# Patient Record
Sex: Female | Born: 1985 | Race: Black or African American | Hispanic: No | Marital: Single | Smoking: Current some day smoker
Health system: Southern US, Community
[De-identification: ages and names within clinical notes are randomized; demographics above are authoritative.]

## PROBLEM LIST (undated history)

## (undated) ENCOUNTER — Inpatient Hospital Stay (HOSPITAL_COMMUNITY): Payer: Self-pay

## (undated) DIAGNOSIS — Z789 Other specified health status: Secondary | ICD-10-CM

## (undated) DIAGNOSIS — Z3490 Encounter for supervision of normal pregnancy, unspecified, unspecified trimester: Secondary | ICD-10-CM

## (undated) DIAGNOSIS — A599 Trichomoniasis, unspecified: Secondary | ICD-10-CM

## (undated) DIAGNOSIS — J40 Bronchitis, not specified as acute or chronic: Secondary | ICD-10-CM

## (undated) DIAGNOSIS — F4310 Post-traumatic stress disorder, unspecified: Secondary | ICD-10-CM

## (undated) DIAGNOSIS — Z87440 Personal history of urinary (tract) infections: Secondary | ICD-10-CM

## (undated) HISTORY — DX: Post-traumatic stress disorder, unspecified: F43.10

## (undated) HISTORY — PX: NO PAST SURGERIES: SHX2092

---

## 2010-04-11 ENCOUNTER — Emergency Department (HOSPITAL_BASED_OUTPATIENT_CLINIC_OR_DEPARTMENT_OTHER)
Admission: EM | Admit: 2010-04-11 | Discharge: 2010-04-11 | Disposition: A | Discharge status: Home / Self Care | Payer: Self-pay | Attending: Emergency Medicine | Admitting: Emergency Medicine

## 2010-04-11 DIAGNOSIS — K089 Disorder of teeth and supporting structures, unspecified: Secondary | ICD-10-CM | POA: Insufficient documentation

## 2010-04-11 DIAGNOSIS — F17200 Nicotine dependence, unspecified, uncomplicated: Secondary | ICD-10-CM | POA: Insufficient documentation

## 2011-01-16 ENCOUNTER — Emergency Department (HOSPITAL_BASED_OUTPATIENT_CLINIC_OR_DEPARTMENT_OTHER)
Admission: EM | Admit: 2011-01-16 | Discharge: 2011-01-16 | Disposition: A | Discharge status: Home / Self Care | Payer: Self-pay | Attending: Emergency Medicine | Admitting: Emergency Medicine

## 2011-01-16 ENCOUNTER — Encounter: Payer: Self-pay | Admitting: *Deleted

## 2011-01-16 DIAGNOSIS — F17200 Nicotine dependence, unspecified, uncomplicated: Secondary | ICD-10-CM | POA: Insufficient documentation

## 2011-01-16 DIAGNOSIS — Z2089 Contact with and (suspected) exposure to other communicable diseases: Secondary | ICD-10-CM | POA: Insufficient documentation

## 2011-01-16 DIAGNOSIS — R21 Rash and other nonspecific skin eruption: Secondary | ICD-10-CM | POA: Insufficient documentation

## 2011-01-16 NOTE — ED Notes (Signed)
Rash on her legs and abdomen for about a month. Itching. All the family members have the same.

## 2011-01-16 NOTE — ED Provider Notes (Signed)
Medical screening examination/treatment/procedure(s) were performed by non-physician practitioner and as supervising physician I was immediately available for consultation/collaboration.   Dayton Bailiff, MD 01/16/11 [phone]

## 2011-01-16 NOTE — ED Provider Notes (Signed)
History     CSN: [id number] Arrival date & time: 01/16/2011  6:21 PM   First MD Initiated Contact with Patient 01/16/11 1828      Chief Complaint  Patient presents with  . Rash    (Consider location/radiation/quality/duration/timing/severity/associated sxs/prior treatment) Patient is a 25 y.o. female presenting with rash. The history is provided by the patient. No language interpreter was used.  Rash  This is a new problem. The current episode started more than 2 days ago. The problem has not changed since onset.The problem is associated with an unknown factor. The rash is present on the abdomen. Associated symptoms include itching.    History reviewed. No pertinent past medical history.  History reviewed. No pertinent past surgical history.  No family history on file.  History  Substance Use Topics  . Smoking status: Current Everyday Smoker -- 1.0 packs/day  . Smokeless tobacco: Not on file  . Alcohol Use: No    OB History    Grav Para Term Preterm Abortions TAB SAB Ect Mult Living                  Review of Systems  Constitutional: Negative.   Respiratory: Negative.   Cardiovascular: Negative.   Skin: Positive for itching and rash.    Allergies  Review of patient's allergies indicates no known allergies.  Home Medications  No current outpatient prescriptions on file.  BP 109/81  Pulse 76  Temp(Src) 98.3 F (36.8 C) (Oral)  Resp 20  SpO2 98%  Physical Exam  Nursing note and vitals reviewed. Constitutional: She appears well-nourished.  Cardiovascular: Normal rate and regular rhythm.   Pulmonary/Chest: Effort normal and breath sounds normal.  Skin:       Pt has a patch of dry scaly skin to abdomen    ED Course  Procedures (including critical care time)  Labs Reviewed - No data to display No results found.   1. Scabies exposure       MDM  Pt son has scabies:rash not consistent with scabies        Teressa Lower, NP 01/16/11  1851

## 2011-11-07 ENCOUNTER — Encounter (HOSPITAL_COMMUNITY): Payer: Self-pay | Admitting: *Deleted

## 2011-11-07 ENCOUNTER — Emergency Department (INDEPENDENT_AMBULATORY_CARE_PROVIDER_SITE_OTHER)
Admission: EM | Admit: 2011-11-07 | Discharge: 2011-11-07 | Disposition: A | Discharge status: Home / Self Care | Payer: Self-pay | Source: Home / Self Care | Attending: Emergency Medicine | Admitting: Emergency Medicine

## 2011-11-07 DIAGNOSIS — H6590 Unspecified nonsuppurative otitis media, unspecified ear: Secondary | ICD-10-CM

## 2011-11-07 DIAGNOSIS — N760 Acute vaginitis: Secondary | ICD-10-CM

## 2011-11-07 DIAGNOSIS — J019 Acute sinusitis, unspecified: Secondary | ICD-10-CM

## 2011-11-07 DIAGNOSIS — A499 Bacterial infection, unspecified: Secondary | ICD-10-CM

## 2011-11-07 DIAGNOSIS — R300 Dysuria: Secondary | ICD-10-CM

## 2011-11-07 DIAGNOSIS — H109 Unspecified conjunctivitis: Secondary | ICD-10-CM

## 2011-11-07 DIAGNOSIS — R109 Unspecified abdominal pain: Secondary | ICD-10-CM

## 2011-11-07 DIAGNOSIS — B9689 Other specified bacterial agents as the cause of diseases classified elsewhere: Secondary | ICD-10-CM

## 2011-11-07 LAB — POCT URINALYSIS DIP (DEVICE)
Glucose, UA: NEGATIVE mg/dL
Hgb urine dipstick: NEGATIVE
Nitrite: NEGATIVE
Urobilinogen, UA: 2 mg/dL — ABNORMAL HIGH (ref 0.0–1.0)
pH: 7 (ref 5.0–8.0)

## 2011-11-07 LAB — WET PREP, GENITAL: Yeast Wet Prep HPF POC: NONE SEEN

## 2011-11-07 MED ORDER — BENZONATATE 200 MG PO CAPS: 200.0000 mg | ORAL_CAPSULE | Freq: Three times a day (TID) | ORAL | Status: AC | PRN

## 2011-11-07 MED ORDER — METRONIDAZOLE 500 MG PO TABS: 500.0000 mg | ORAL_TABLET | Freq: Two times a day (BID) | ORAL | Status: AC

## 2011-11-07 MED ORDER — AMOXICILLIN 500 MG PO CAPS: 1000.0000 mg | ORAL_CAPSULE | Freq: Three times a day (TID) | ORAL | Status: AC

## 2011-11-07 MED ORDER — POLYMYXIN B-TRIMETHOPRIM 10000-0.1 UNIT/ML-% OP SOLN: 1.0000 [drp] | OPHTHALMIC | Status: AC

## 2011-11-07 NOTE — ED Notes (Signed)
Pt  Reports  Symptoms  Of  Irritated  Crusty  Drainage   From  Both  Eyes    Symptoms  Began  About  4  Days  Ago   -  She        denys  Any  Known  Foreign  Body

## 2011-11-07 NOTE — ED Provider Notes (Addendum)
Chief Complaint  Patient presents with  . Eye Drainage    History of Present Illness:   The patient is a 26 year old female who comes in today with a number of different complaints: URI symptoms, drainage from her eyes, abdominal pain, urinary frequency, and vaginal discharge.  For the past 5 days she has had redness, itching, and pain in both of her eyes along with some purulent drainage. She denies any visual changes. She's felt chilled, had sore throat, nasal congestion with clear rhinorrhea, congestion in both of her ears, and a dry cough. She denies any fever.  She also has had a one-week history of a recurring pain in her entire right hemiabdomen ranging from the right lower quadrant to the right upper quadrant and the right flank. This comes and goes. She's not had any associated nausea or vomiting. Her appetite has been good and she has not had any weight loss. The pain is not any worse with eating or with intercourse. She also has had some urinary frequency but denies urgency, dysuria, or hematuria. She's had some slight vaginal discharge and itching. Her menses have been regular.  Review of Systems:  Other than noted above, the patient denies any of the following symptoms. Systemic:  No fever, chills, sweats, fatigue, myalgias, headache, or anorexia. Eye:  No redness, pain or drainage. ENT:  No earache, nasal congestion, rhinorrhea, sinus pressure, or sore throat. Lungs:  No cough, sputum production, wheezing, shortness of breath.  Cardiovascular:  No chest pain, palpitations, or syncope. GI:  No nausea, vomiting, abdominal pain or diarrhea. GU:  No dysuria, frequency, or hematuria. Skin:  No rash or pruritis.  PMFSH:  Past medical history, family history, social history, meds, and allergies were reviewed.   Physical Exam:   Vital signs:  BP 105/68  Pulse 58  Temp 98.5 F (36.9 C)  Resp 16  SpO2 98%  LMP 10/24/2011 General:  Alert, in no distress. Eye:  PERRL, full EOMs.   Lids and periorbital tissues are normal, conjunctiva is were slightly injected, there was no discharge, but there was some crusting on the eyelids. ENT:  There was fluid behind the left TM but no erythema, the right TM and canal are normal.  Nasal mucosa was congested with some yellowish drainage on the left.  Mucous membranes were moist.  Pharynx was clear, without exudate or drainage.  There were no oral ulcerations or lesions. Neck:  Supple, no adenopathy, tenderness or mass. Thyroid was normal. Lungs:  No respiratory distress.  Lungs were clear to auscultation, without wheezes, rales or rhonchi.  Breath sounds were clear and equal bilaterally. Heart:  Regular rhythm, without gallops, murmers or rubs. Abdomen:  Soft, flat, with mild tenderness to palpation in the right lower quadrant and right flank without guarding or rebound.  No hepatosplenomagaly or mass. Bowel sounds are normally active. Pelvic: Normal external genitalia. Vaginal and cervical mucosa were normal. There was a moderate amount of homogeneous, white, non-malodorous discharge. There was no cervical motion pain. Uterus was midposition and normal in size and shape without any tenderness. She has mild left adnexal tenderness without a mass and no right adnexal tenderness or mass. Skin:  Clear, warm, and dry, without rash or lesions.    Labs:   Results for orders placed during the hospital encounter of 11/07/11  POCT URINALYSIS DIP (DEVICE)      Component Value Range   Glucose, UA NEGATIVE  NEGATIVE mg/dL   Bilirubin Urine SMALL (*) NEGATIVE  Ketones, ur NEGATIVE  NEGATIVE mg/dL   Specific Gravity, Urine 1.020  1.005 - 1.030   Hgb urine dipstick NEGATIVE  NEGATIVE   pH 7.0  5.0 - 8.0   Protein, ur 30 (*) NEGATIVE mg/dL   Urobilinogen, UA 2.0 (*) 0.0 - 1.0 mg/dL   Nitrite NEGATIVE  NEGATIVE   Leukocytes, UA TRACE (*) NEGATIVE  WET PREP, GENITAL      Component Value Range   Yeast Wet Prep HPF POC NONE SEEN  NONE SEEN    Trich, Wet Prep FEW (*) NONE SEEN   Clue Cells Wet Prep HPF POC MANY (*) NONE SEEN   WBC, Wet Prep HPF POC MANY (*) NONE SEEN    Other Labs Obtained at Urgent Care Center:  A urine culture was obtained as well as cervical smear for GC and Chlamydia DNA probe.  Results are pending at this time and we will call about any positive results.  Assessment:  The primary encounter diagnosis was Conjunctivitis. Diagnoses of Acute sinusitis, Serous otitis media, Bacterial vaginosis, Abdominal  pain, other specified site, and Dysuria were also pertinent to this visit.  She appears to have a several problems. The first is an upper respiratory infection with conjunctivitis, sinusitis, and serous otitis media. The second problem is that of vaginal discharge and she has evidence of both bacterial vaginosis and Trichomonas vaginitis as well. This will be treated with metronidazole. I'm not sure is the cause of her abdominal pain, but is suggested she see her gynecologist for followup on this, if it persists she will probably need a pelvic ultrasound.  Plan:   1.  The following meds were prescribed:   New Prescriptions   AMOXICILLIN (AMOXIL) 500 MG CAPSULE    Take 2 capsules (1,000 mg total) by mouth 3 (three) times daily.   BENZONATATE (TESSALON) 200 MG CAPSULE    Take 1 capsule (200 mg total) by mouth 3 (three) times daily as needed for cough.   METRONIDAZOLE (FLAGYL) 500 MG TABLET    Take 1 tablet (500 mg total) by mouth 2 (two) times daily.   TRIMETHOPRIM-POLYMYXIN B (POLYTRIM) OPHTHALMIC SOLUTION    Place 1 drop into both eyes every 4 (four) hours.   2.  The patient was instructed in symptomatic care and handouts were given. 3.  The patient was told to return if becoming worse in any way, if no better in 3 or 4 days, and given some red flag symptoms that would indicate earlier return.    Reuben Likes, MD 11/07/11 1610  Reuben Likes, MD 11/07/11 [phone]

## 2011-11-08 LAB — GC/CHLAMYDIA PROBE AMP, GENITAL: GC Probe Amp, Genital: NEGATIVE

## 2011-11-09 LAB — URINE CULTURE

## 2011-11-10 ENCOUNTER — Telehealth (HOSPITAL_COMMUNITY): Payer: Self-pay | Admitting: *Deleted

## 2011-11-10 NOTE — ED Notes (Signed)
Gc/Chlamydia neg., Wet prep: Few trich, many clue cells, many WBC's, Urine culture: 60,000 colonies mult. bacterial types none predominant.  Pt. adequately treated with Flagyl.   I called and left a message for pt. To call back Mon after 1130.Vassie Moselle 11/10/2011

## 2011-11-13 ENCOUNTER — Telehealth (HOSPITAL_COMMUNITY): Payer: Self-pay | Admitting: *Deleted

## 2011-11-13 NOTE — ED Notes (Signed)
Pt. called back on VM on 9/13.  Pt. called back again now.  Pt. verified x 2 and given results.  Pt. told she was adequately treated for trich and bacterial vaginosis with Flagyl. You need to notify your partner to be treated with Flagyl, no sex until you have finished your medication and your partner has been treated and to practice safe sex. You can get HIV testing at the [facility] STD clinic.  Pt. asked if I can call in a Rx. For her partner. I told her he would have to see the doctor. Vassie Moselle 11/13/2011

## 2011-11-23 ENCOUNTER — Emergency Department (HOSPITAL_COMMUNITY)
Admission: EM | Admit: 2011-11-23 | Discharge: 2011-11-23 | Disposition: A | Discharge status: Home / Self Care | Payer: No Typology Code available for payment source | Attending: Emergency Medicine | Admitting: Emergency Medicine

## 2011-11-23 ENCOUNTER — Emergency Department (HOSPITAL_COMMUNITY): Payer: No Typology Code available for payment source

## 2011-11-23 ENCOUNTER — Encounter (HOSPITAL_COMMUNITY): Payer: Self-pay | Admitting: Emergency Medicine

## 2011-11-23 DIAGNOSIS — F17200 Nicotine dependence, unspecified, uncomplicated: Secondary | ICD-10-CM | POA: Insufficient documentation

## 2011-11-23 DIAGNOSIS — S60219A Contusion of unspecified wrist, initial encounter: Secondary | ICD-10-CM | POA: Insufficient documentation

## 2011-11-23 MED ORDER — IBUPROFEN 800 MG PO TABS: 800.0000 mg | ORAL_TABLET | Freq: Three times a day (TID) | ORAL | Status: DC

## 2011-11-23 MED ORDER — IBUPROFEN 400 MG PO TABS
800.0000 mg | ORAL_TABLET | Freq: Once | ORAL | Status: AC
  Administered 2011-11-23: 800 mg via ORAL
  Filled 2011-11-23: qty 2

## 2011-11-23 NOTE — Discharge Instructions (Signed)
Contusion There is no evidence of broken bone. Follow up with the orthopedic doctor as needed. Return to the ED if you develop new or worsening symptoms. A contusion is a deep bruise. Contusions are the result of an injury that caused bleeding under the skin. The contusion may turn blue, purple, or yellow. Minor injuries will give you a painless contusion, but more severe contusions may stay painful and swollen for a few weeks.  CAUSES  A contusion is usually caused by a blow, trauma, or direct force to an area of the body. SYMPTOMS   Swelling and redness of the injured area.   Bruising of the injured area.   Tenderness and soreness of the injured area.   Pain.  DIAGNOSIS  The diagnosis can be made by taking a history and physical exam. An X-ray, CT scan, or MRI may be needed to determine if there were any associated injuries, such as fractures. TREATMENT  Specific treatment will depend on what area of the body was injured. In general, the best treatment for a contusion is resting, icing, elevating, and applying cold compresses to the injured area. Over-the-counter medicines may also be recommended for pain control. Ask your caregiver what the best treatment is for your contusion. HOME CARE INSTRUCTIONS   Put ice on the injured area.   Put ice in a plastic bag.   Place a towel between your skin and the bag.   Leave the ice on for 15 to 20 minutes, 3 to 4 times a day.   Only take over-the-counter or prescription medicines for pain, discomfort, or fever as directed by your caregiver. Your caregiver may recommend avoiding anti-inflammatory medicines (aspirin, ibuprofen, and naproxen) for 48 hours because these medicines may increase bruising.   Rest the injured area.   If possible, elevate the injured area to reduce swelling.  SEEK IMMEDIATE MEDICAL CARE IF:   You have increased bruising or swelling.   You have pain that is getting worse.   Your swelling or pain is not relieved  with medicines.  MAKE SURE YOU:   Understand these instructions.   Will watch your condition.   Will get help right away if you are not doing well or get worse.  Document Released: 11/23/2004 Document Revised: 02/02/2011 Document Reviewed: 12/19/2010 [facility] Patient Information 2012 [place], [place].

## 2011-11-23 NOTE — Progress Notes (Signed)
Orthopedic Tech Progress Note Patient Details:  Valerie Reyes [birth year] [id number]  Ortho Devices Type of Ortho Device: Velcro wrist splint Ortho Device/Splint Location: left wrist splint Ortho Device/Splint Interventions: Application   Cammer, Mickie Bail 11/23/2011, 2:01 PM
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
## 2012-01-19 ENCOUNTER — Emergency Department (HOSPITAL_COMMUNITY): Payer: Self-pay

## 2012-01-19 ENCOUNTER — Emergency Department (HOSPITAL_COMMUNITY)
Admission: EM | Admit: 2012-01-19 | Discharge: 2012-01-19 | Disposition: A | Discharge status: Home / Self Care | Payer: Self-pay | Attending: Emergency Medicine | Admitting: Emergency Medicine

## 2012-01-19 ENCOUNTER — Encounter (HOSPITAL_COMMUNITY): Payer: Self-pay | Admitting: Emergency Medicine

## 2012-01-19 DIAGNOSIS — O99330 Smoking (tobacco) complicating pregnancy, unspecified trimester: Secondary | ICD-10-CM | POA: Insufficient documentation

## 2012-01-19 DIAGNOSIS — R300 Dysuria: Secondary | ICD-10-CM | POA: Insufficient documentation

## 2012-01-19 DIAGNOSIS — N949 Unspecified condition associated with female genital organs and menstrual cycle: Secondary | ICD-10-CM | POA: Insufficient documentation

## 2012-01-19 DIAGNOSIS — Z3490 Encounter for supervision of normal pregnancy, unspecified, unspecified trimester: Secondary | ICD-10-CM

## 2012-01-19 DIAGNOSIS — R350 Frequency of micturition: Secondary | ICD-10-CM | POA: Insufficient documentation

## 2012-01-19 DIAGNOSIS — R109 Unspecified abdominal pain: Secondary | ICD-10-CM | POA: Insufficient documentation

## 2012-01-19 DIAGNOSIS — Z8709 Personal history of other diseases of the respiratory system: Secondary | ICD-10-CM | POA: Insufficient documentation

## 2012-01-19 DIAGNOSIS — O9989 Other specified diseases and conditions complicating pregnancy, childbirth and the puerperium: Secondary | ICD-10-CM | POA: Insufficient documentation

## 2012-01-19 HISTORY — DX: Encounter for supervision of normal pregnancy, unspecified, unspecified trimester: Z34.90

## 2012-01-19 HISTORY — DX: Bronchitis, not specified as acute or chronic: J40

## 2012-01-19 LAB — CBC WITH DIFFERENTIAL/PLATELET
Basophils Absolute: 0 K/uL (ref 0.0–0.1)
Basophils Relative: 0 % (ref 0–1)
Eosinophils Absolute: 0.1 K/uL (ref 0.0–0.7)
Eosinophils Relative: 1 % (ref 0–5)
HCT: 38.2 % (ref 36.0–46.0)
Hemoglobin: 13.4 g/dL (ref 12.0–15.0)
Lymphocytes Relative: 30 % (ref 12–46)
Lymphs Abs: 2.8 K/uL (ref 0.7–4.0)
MCH: 29.8 pg (ref 26.0–34.0)
MCHC: 35.1 g/dL (ref 30.0–36.0)
MCV: 84.9 fL (ref 78.0–100.0)
Monocytes Absolute: 0.5 K/uL (ref 0.1–1.0)
Monocytes Relative: 6 % (ref 3–12)
Neutro Abs: 5.9 K/uL (ref 1.7–7.7)
Neutrophils Relative %: 63 % (ref 43–77)
Platelets: 219 K/uL (ref 150–400)
RBC: 4.5 MIL/uL (ref 3.87–5.11)
RDW: 12.5 % (ref 11.5–15.5)
WBC: 9.4 K/uL (ref 4.0–10.5)

## 2012-01-19 LAB — BASIC METABOLIC PANEL WITH GFR
BUN: 6 mg/dL (ref 6–23)
CO2: 22 meq/L (ref 19–32)
Calcium: 9.3 mg/dL (ref 8.4–10.5)
Chloride: 98 meq/L (ref 96–112)
Creatinine, Ser: 0.5 mg/dL (ref 0.50–1.10)
GFR calc Af Amer: >90 mL/min
GFR calc non Af Amer: >90 mL/min
Glucose, Bld: 89 mg/dL (ref 70–99)
Potassium: 3.8 meq/L (ref 3.5–5.1)
Sodium: 131 meq/L — ABNORMAL LOW (ref 135–145)

## 2012-01-19 LAB — URINALYSIS, ROUTINE W REFLEX MICROSCOPIC
Glucose, UA: NEGATIVE mg/dL
Hgb urine dipstick: NEGATIVE
Ketones, ur: NEGATIVE mg/dL
Protein, ur: NEGATIVE mg/dL

## 2012-01-19 LAB — WET PREP, GENITAL
Clue Cells Wet Prep HPF POC: NONE SEEN
Trich, Wet Prep: NONE SEEN
Yeast Wet Prep HPF POC: NONE SEEN

## 2012-01-19 LAB — HCG, QUANTITATIVE, PREGNANCY: hCG, Beta Chain, Quant, S: 114067 m[IU]/mL — ABNORMAL HIGH

## 2012-01-19 MED ORDER — CEFTRIAXONE SODIUM 250 MG IJ SOLR
250.0000 mg | Freq: Once | INTRAMUSCULAR | Status: AC
  Administered 2012-01-19: 250 mg via INTRAMUSCULAR
  Filled 2012-01-19: qty 250

## 2012-01-19 MED ORDER — PRENATAL COMPLETE 14-0.4 MG PO TABS: 1.0000 | ORAL_TABLET | Freq: Every day | ORAL | Status: DC

## 2012-01-19 MED ORDER — AZITHROMYCIN 250 MG PO TABS
1000.0000 mg | ORAL_TABLET | Freq: Once | ORAL | Status: AC
  Administered 2012-01-19: 1000 mg via ORAL
  Filled 2012-01-19: qty 4

## 2012-01-19 NOTE — ED Notes (Signed)
Patient is 8.[redacted] weeks pregnant.  Patient c/o bilateral lower abdominal and right flank pain.  Patient complains that pain worsens with urination.  Patient denies fevers.

## 2012-01-19 NOTE — ED Notes (Signed)
Lab at bedside

## 2012-01-19 NOTE — ED Provider Notes (Signed)
History     CSN: [id number]  Arrival date & time 01/19/12  1544   First MD Initiated Contact with Patient 01/19/12 1600      Chief Complaint  Patient presents with  . Dysuria  . Abdominal Pain    (Consider location/radiation/quality/duration/timing/severity/associated sxs/prior treatment) HPI Comments: Patient G1P0A0 presents to ED with complaint of lower abdominal pain. Associated symptoms include suprapubic urgency and frequency. Patient LMP was 11/22/11. She states that she recently went to the pregnancy clinic who told her that her fetus is viable. Currently taking prenatal vitamins with plans to establish appropriate prenatal care. Denies fever or chills. Denies vomiting or diarrhea. Denies vaginal bleeding but reports concerning discharge. Sexually active without barrier method.  The history is provided by the patient. No language interpreter was used.    Past Medical History  Diagnosis Date  . Pregnancy   . Bronchitis     History reviewed. No pertinent past surgical history.  History reviewed. No pertinent family history.  History  Substance Use Topics  . Smoking status: Current Every Day Smoker -- 1.0 packs/day    Types: Cigarettes  . Smokeless tobacco: Not on file  . Alcohol Use: No    OB History    Grav Para Term Preterm Abortions TAB SAB Ect Mult Living                  Review of Systems  Constitutional: Negative for fever and chills.  Gastrointestinal: Positive for nausea and abdominal pain. Negative for vomiting and diarrhea.  Genitourinary: Positive for menstrual problem and pelvic pain.    Allergies  Review of patient's allergies indicates no known allergies.  Home Medications   Current Outpatient Rx  Name  Route  Sig  Dispense  Refill  . IBUPROFEN 800 MG PO TABS   Oral   Take 800 mg by mouth 3 (three) times daily. Pain           BP 102/68  Pulse 71  Temp 99.5 F (37.5 C) (Oral)  Resp 20  Ht 5\' 2"  (1.575 m)  Wt 122 lb (55.339 kg)   BMI 22.31 kg/m2  SpO2 100%  LMP 11/22/2011  Physical Exam  Nursing note and vitals reviewed. Constitutional: She appears well-developed and well-nourished.  HENT:  Head: Normocephalic and atraumatic.  Mouth/Throat: Oropharynx is clear and moist.  Eyes: Pupils are equal, round, and reactive to light. No scleral icterus.  Neck: Normal range of motion. Neck supple.  Cardiovascular: Normal rate, regular rhythm and normal heart sounds.   Pulmonary/Chest: Effort normal and breath sounds normal.  Abdominal: Soft. Bowel sounds are normal. There is tenderness.       Suprapubic, LLQ tenderness to palpation.   Genitourinary: Vaginal discharge found.       Moderate amount of vaginal discharge.  Neurological: She is alert.  Skin: Skin is warm and dry.    ED Course  Procedures (including critical care time)  Labs Reviewed  PREGNANCY, URINE - Abnormal; Notable for the following:    Preg Test, Ur POSITIVE (*)     All other components within normal limits  URINALYSIS, ROUTINE W REFLEX MICROSCOPIC  CBC WITH DIFFERENTIAL  GC/CHLAMYDIA PROBE AMP  WET PREP, GENITAL  HCG, QUANTITATIVE, PREGNANCY  BASIC METABOLIC PANEL   Results for orders placed during the hospital encounter of 01/19/12  URINALYSIS, ROUTINE W REFLEX MICROSCOPIC      Component Value Range   Color, Urine YELLOW  YELLOW   APPearance CLEAR  CLEAR   Specific  Gravity, Urine 1.008  1.005 - 1.030   pH 6.0  5.0 - 8.0   Glucose, UA NEGATIVE  NEGATIVE mg/dL   Hgb urine dipstick NEGATIVE  NEGATIVE   Bilirubin Urine NEGATIVE  NEGATIVE   Ketones, ur NEGATIVE  NEGATIVE mg/dL   Protein, ur NEGATIVE  NEGATIVE mg/dL   Urobilinogen, UA 0.2  0.0 - 1.0 mg/dL   Nitrite NEGATIVE  NEGATIVE   Leukocytes, UA NEGATIVE  NEGATIVE  PREGNANCY, URINE      Component Value Range   Preg Test, Ur POSITIVE (*) NEGATIVE  WET PREP, GENITAL      Component Value Range   Yeast Wet Prep HPF POC NONE SEEN  NONE SEEN   Trich, Wet Prep NONE SEEN  NONE SEEN     Clue Cells Wet Prep HPF POC NONE SEEN  NONE SEEN   WBC, Wet Prep HPF POC FEW (*) NONE SEEN  HCG, QUANTITATIVE, PREGNANCY      Component Value Range   hCG, Beta Chain, Quant, S [id number] (*) <5 mIU/mL  CBC WITH DIFFERENTIAL      Component Value Range   WBC 9.4  4.0 - 10.5 K/uL   RBC 4.50  3.87 - 5.11 MIL/uL   Hemoglobin 13.4  12.0 - 15.0 g/dL   HCT 91.4  78.2 - 95.6 %   MCV 84.9  78.0 - 100.0 fL   MCH 29.8  26.0 - 34.0 pg   MCHC 35.1  30.0 - 36.0 g/dL   RDW 21.3  08.6 - 57.8 %   Platelets 219  150 - 400 K/uL   Neutrophils Relative 63  43 - 77 %   Neutro Abs 5.9  1.7 - 7.7 K/uL   Lymphocytes Relative 30  12 - 46 %   Lymphs Abs 2.8  0.7 - 4.0 K/uL   Monocytes Relative 6  3 - 12 %   Monocytes Absolute 0.5  0.1 - 1.0 K/uL   Eosinophils Relative 1  0 - 5 %   Eosinophils Absolute 0.1  0.0 - 0.7 K/uL   Basophils Relative 0  0 - 1 %   Basophils Absolute 0.0  0.0 - 0.1 K/uL  BASIC METABOLIC PANEL      Component Value Range   Sodium 131 (*) 135 - 145 mEq/L   Potassium 3.8  3.5 - 5.1 mEq/L   Chloride 98  96 - 112 mEq/L   CO2 22  19 - 32 mEq/L   Glucose, Bld 89  70 - 99 mg/dL   BUN 6  6 - 23 mg/dL   Creatinine, Ser 4.69  0.50 - 1.10 mg/dL   Calcium 9.3  8.4 - 62.9 mg/dL   GFR calc non Af Amer >90  >90 mL/min   GFR calc Af Amer >90  >90 mL/min    No results found.   1. Pregnancy       MDM  Patient presented with abdominal pain during pregnancy. Imaging remarkable for viable pregnancy. Labs confirmed [redacted]w[redacted]d pregnancy otherwise unremarkable. Patient discharged with Rx for prenatal vitamins and OB/gyn follow-up. Return precautions given. No red flags for ectopic.        Pixie Casino, PA-C 01/19/12 2006

## 2012-01-20 NOTE — ED Provider Notes (Signed)
Medical screening examination/treatment/procedure(s) were conducted as a shared visit with non-physician practitioner(s) and myself.  I personally evaluated the patient during the encounter  Pt comes in w/ vaginal discharge. PA's concern was PID. My exam indicated no CMT, she did have mild adnexal fullness/tenderness on the right side. [place] ordered to r.o TOa, ectopic. Home discharge with close pcp f/u.  Derwood Kaplan, MD 01/20/12 [phone]

## 2012-04-09 ENCOUNTER — Encounter (HOSPITAL_COMMUNITY): Payer: Self-pay

## 2012-04-09 ENCOUNTER — Inpatient Hospital Stay (HOSPITAL_COMMUNITY)
Admission: AD | Admit: 2012-04-09 | Discharge: 2012-04-09 | Disposition: A | Discharge status: Home / Self Care | Payer: Medicaid Other | Source: Ambulatory Visit | Attending: Obstetrics & Gynecology | Admitting: Obstetrics & Gynecology

## 2012-04-09 DIAGNOSIS — O26859 Spotting complicating pregnancy, unspecified trimester: Secondary | ICD-10-CM

## 2012-04-09 DIAGNOSIS — R109 Unspecified abdominal pain: Secondary | ICD-10-CM

## 2012-04-09 DIAGNOSIS — N949 Unspecified condition associated with female genital organs and menstrual cycle: Secondary | ICD-10-CM

## 2012-04-09 HISTORY — DX: Other specified health status: Z78.9

## 2012-04-09 LAB — URINALYSIS, ROUTINE W REFLEX MICROSCOPIC
Glucose, UA: NEGATIVE mg/dL
Hgb urine dipstick: NEGATIVE
Ketones, ur: NEGATIVE mg/dL
Protein, ur: NEGATIVE mg/dL

## 2012-04-09 LAB — WET PREP, GENITAL
Trich, Wet Prep: NONE SEEN
Yeast Wet Prep HPF POC: NONE SEEN

## 2012-04-09 MED ORDER — METRONIDAZOLE 500 MG PO TABS: 500.0000 mg | ORAL_TABLET | Freq: Two times a day (BID) | ORAL | Status: DC

## 2012-04-09 NOTE — MAU Note (Signed)
Left sided abdominal pain x2 weeks that is intermittent. Brown spotting on toilet paper for the last couple of days. White mucous like/thick discharge that has foul odor at times; discharge x 1 week.  Unsure if has started feeling fetal movement.

## 2012-04-09 NOTE — MAU Provider Note (Signed)
Chief Complaint:  Vaginal Bleeding and Abdominal Pain   First Provider Initiated Contact with Patient 04/09/12 2157     HPI: Valerie Reyes is a 27 y.o. G1P0 at [redacted]w[redacted]d who presents to maternity admissions reporting two days of vaginal spotting and two weeks of lower abdominal pain that is not worsened by position.  Pt has not had prenatal care to this point due to insurance reasons and is dated per US done on 01/19/12 at [facility] ED.  Pt has had some lower abdominal crampy pain, intermittent, that is not triggered by any inciting events.  Pt was continues to have this and two days ago states she started to develop some vaginal spotting along with discharge.  She has not had sexual intercourse in the last 1 week and denies having to change her underwear or use pads over the last two days.    Denies contractions, dysuria, hematuria, increased urgency, or leakage of fluid.  Good fetal movement.   Pregnancy Course: None to date  Past Medical History: Past Medical History  Diagnosis Date  . Pregnancy   . Bronchitis   . Medical history non-contributory     Past obstetric history: OB History   Grav Para Term Preterm Abortions TAB SAB Ect Mult Living   1              # Outc Date GA Lbr Len/2nd Wgt Sex Del Anes PTL Lv   1 CUR               Past Surgical History: Past Surgical History  Procedure Laterality Date  . No past surgeries      Family History: Family History  Problem Relation Age of Onset  . Cancer Mother   . Diabetes Mother   . Cancer Maternal Aunt     lung  . Diabetes Maternal Grandmother     Social History: History  Substance Use Topics  . Smoking status: Former Smoker -- 1.00 packs/day    Types: Cigarettes    Quit date: 04/09/2012  . Smokeless tobacco: Not on file  . Alcohol Use: No    Allergies: No Known Allergies  Meds:  Prescriptions prior to admission  Medication Sig Dispense Refill  . Prenatal Vit-Fe Fumarate-FA (PRENATAL COMPLETE) 14-0.4 MG TABS Take 1  tablet by mouth daily.  60 each  3  . ibuprofen (ADVIL,MOTRIN) 800 MG tablet Take 800 mg by mouth 3 (three) times daily. Pain        ROS: Pertinent findings in history of present illness.  Physical Exam  Blood pressure 116/70, temperature 98.3 F (36.8 C), temperature source Oral, resp. rate 16, height 5\' 1"  (1.549 m), weight 60.782 kg (134 lb), last menstrual period 11/22/2011. GENERAL: Well-developed, well-nourished female in no acute distress.  HEENT: normocephalic HEART: RRR RESP: normal effort ABDOMEN: Soft, non-tender, gravid appropriate for gestational age, no CVA tenderness, no suprapubic tenderness EXTREMITIES: Nontender, no edema NEURO: alert and oriented SPECULUM EXAM: NEFG, physiologic discharge, no blood, cervix clean    Doppler:  Baseline 160 Contractions: None   Labs: No results found for this or any previous visit (from the past 24 hour(s)).  Imaging:  No results found. MAU Course: 1) UA, GC/Chlamydia, Wet Prep    Assessment: 1. Round ligament pain     Plan: Discharge home Labor precautions and fetal kick counts Round Ligament Pain information given and message sent to clinic to schedule initial OB    Medication List    TAKE these medications  ibuprofen 800 MG tablet  Commonly known as:  ADVIL,MOTRIN  Take 800 mg by mouth 3 (three) times daily. Pain     Prenatal Complete 14-0.4 MG Tabs  Take 1 tablet by mouth daily.        Briscoe Deutscher, DO 04/09/2012 9:57 PM  I have seen and examined this patient and I agree with the above. Evolet Salminen 3:02 AM 04/10/2012

## 2012-04-10 LAB — GC/CHLAMYDIA PROBE AMP: GC Probe RNA: NEGATIVE

## 2012-04-10 NOTE — MAU Provider Note (Signed)
Attestation of Attending Supervision of Advanced Practitioner (PA/CNM/NP): Evaluation and management procedures were performed by the Advanced Practitioner under my supervision and collaboration.  I have reviewed the Advanced Practitioner's note and chart, and I agree with the management and plan.  Keiasha Diep, MD, FACOG Attending Obstetrician & Gynecologist Faculty Practice, [facility]  

## 2012-04-23 ENCOUNTER — Encounter: Payer: Self-pay | Admitting: Obstetrics & Gynecology

## 2012-04-23 ENCOUNTER — Ambulatory Visit (INDEPENDENT_AMBULATORY_CARE_PROVIDER_SITE_OTHER): Payer: Self-pay | Admitting: Obstetrics & Gynecology

## 2012-04-23 ENCOUNTER — Other Ambulatory Visit: Payer: Self-pay | Admitting: Obstetrics & Gynecology

## 2012-04-23 VITALS — BP 109/66 | Temp 98.3°F | Wt 133.1 lb

## 2012-04-23 DIAGNOSIS — Z23 Encounter for immunization: Secondary | ICD-10-CM

## 2012-04-23 DIAGNOSIS — Z3492 Encounter for supervision of normal pregnancy, unspecified, second trimester: Secondary | ICD-10-CM

## 2012-04-23 DIAGNOSIS — O0932 Supervision of pregnancy with insufficient antenatal care, second trimester: Secondary | ICD-10-CM

## 2012-04-23 DIAGNOSIS — O0930 Supervision of pregnancy with insufficient antenatal care, unspecified trimester: Secondary | ICD-10-CM | POA: Insufficient documentation

## 2012-04-23 LAB — HIV ANTIBODY (ROUTINE TESTING W REFLEX): HIV: NONREACTIVE

## 2012-04-23 LAB — POCT URINALYSIS DIP (DEVICE)
Bilirubin Urine: NEGATIVE
Hgb urine dipstick: NEGATIVE
Ketones, ur: NEGATIVE mg/dL
Specific Gravity, Urine: 1.015 (ref 1.005–1.030)
pH: 6 (ref 5.0–8.0)

## 2012-04-23 MED ORDER — INFLUENZA VIRUS VACC SPLIT PF IM SUSP: 0.5000 mL | Freq: Once | INTRAMUSCULAR | Status: DC

## 2012-04-23 NOTE — Patient Instructions (Signed)
Pregnancy - Second Trimester The second trimester of pregnancy (3 to 6 months) is a period of rapid growth for you and your baby. At the end of the sixth month, your baby is about 9 inches long and weighs 1 1/2 pounds. You will begin to feel the baby move between 18 and 20 weeks of the pregnancy. This is called quickening. Weight gain is faster. A clear fluid (colostrum) may leak out of your breasts. You may feel small contractions of the womb (uterus). This is known as false labor or Braxton-Hicks contractions. This is like a practice for labor when the baby is ready to be born. Usually, the problems with morning sickness have usually passed by the end of your first trimester. Some women develop small dark blotches (called cholasma, mask of pregnancy) on their face that usually goes away after the baby is born. Exposure to the sun makes the blotches worse. Acne may also develop in some pregnant women and pregnant women who have acne, may find that it goes away. PRENATAL EXAMS  Blood work may continue to be done during prenatal exams. These tests are done to check on your health and the probable health of your baby. Blood work is used to follow your blood levels (hemoglobin). Anemia (low hemoglobin) is common during pregnancy. Iron and vitamins are given to help prevent this. You will also be checked for diabetes between 24 and 28 weeks of the pregnancy. Some of the previous blood tests may be repeated.  The size of the uterus is measured during each visit. This is to make sure that the baby is continuing to grow properly according to the dates of the pregnancy.  Your blood pressure is checked every prenatal visit. This is to make sure you are not getting toxemia.  Your urine is checked to make sure you do not have an infection, diabetes or protein in the urine.  Your weight is checked often to make sure gains are happening at the suggested rate. This is to ensure that both you and your baby are growing  normally.  Sometimes, an ultrasound is performed to confirm the proper growth and development of the baby. This is a test which bounces harmless sound waves off the baby so your caregiver can more accurately determine due dates. Sometimes, a specialized test is done on the amniotic fluid surrounding the baby. This test is called an amniocentesis. The amniotic fluid is obtained by sticking a needle into the belly (abdomen). This is done to check the chromosomes in instances where there is a concern about possible genetic problems with the baby. It is also sometimes done near the end of pregnancy if an early delivery is required. In this case, it is done to help make sure the baby's lungs are mature enough for the baby to live outside of the womb. CHANGES OCCURING IN THE SECOND TRIMESTER OF PREGNANCY Your body goes through many changes during pregnancy. They vary from person to person. Talk to your caregiver about changes you notice that you are concerned about.  During the second trimester, you will likely have an increase in your appetite. It is normal to have cravings for certain foods. This varies from person to person and pregnancy to pregnancy.  Your lower abdomen will begin to bulge.  You may have to urinate more often because the uterus and baby are pressing on your bladder. It is also common to get more bladder infections during pregnancy (pain with urination). You can help this by   drinking lots of fluids and emptying your bladder before and after intercourse.  You may begin to get stretch marks on your hips, abdomen, and breasts. These are normal changes in the body during pregnancy. There are no exercises or medications to take that prevent this change.  You may begin to develop swollen and bulging veins (varicose veins) in your legs. Wearing support hose, elevating your feet for 15 minutes, 3 to 4 times a day and limiting salt in your diet helps lessen the problem.  Heartburn may develop  as the uterus grows and pushes up against the stomach. Antacids recommended by your caregiver helps with this problem. Also, eating smaller meals 4 to 5 times a day helps.  Constipation can be treated with a stool softener or adding bulk to your diet. Drinking lots of fluids, vegetables, fruits, and whole grains are helpful.  Exercising is also helpful. If you have been very active up until your pregnancy, most of these activities can be continued during your pregnancy. If you have been less active, it is helpful to start an exercise program such as walking.  Hemorrhoids (varicose veins in the rectum) may develop at the end of the second trimester. Warm sitz baths and hemorrhoid cream recommended by your caregiver helps hemorrhoid problems.  Backaches may develop during this time of your pregnancy. Avoid heavy lifting, wear low heal shoes and practice good posture to help with backache problems.  Some pregnant women develop tingling and numbness of their hand and fingers because of swelling and tightening of ligaments in the wrist (carpel tunnel syndrome). This goes away after the baby is born.  As your breasts enlarge, you may have to get a bigger bra. Get a comfortable, cotton, support bra. Do not get a nursing bra until the last month of the pregnancy if you will be nursing the baby.  You may get a dark line from your belly button to the pubic area called the linea nigra.  You may develop rosy cheeks because of increase blood flow to the face.  You may develop spider looking lines of the face, neck, arms and chest. These go away after the baby is born. HOME CARE INSTRUCTIONS   It is extremely important to avoid all smoking, herbs, alcohol, and unprescribed drugs during your pregnancy. These chemicals affect the formation and growth of the baby. Avoid these chemicals throughout the pregnancy to ensure the delivery of a healthy infant.  Most of your home care instructions are the same as  suggested for the first trimester of your pregnancy. Keep your caregiver's appointments. Follow your caregiver's instructions regarding medication use, exercise and diet.  During pregnancy, you are providing food for you and your baby. Continue to eat regular, well-balanced meals. Choose foods such as meat, fish, milk and other low fat dairy products, vegetables, fruits, and whole-grain breads and cereals. Your caregiver will tell you of the ideal weight gain.  A physical sexual relationship may be continued up until near the end of pregnancy if there are no other problems. Problems could include early (premature) leaking of amniotic fluid from the membranes, vaginal bleeding, abdominal pain, or other medical or pregnancy problems.  Exercise regularly if there are no restrictions. Check with your caregiver if you are unsure of the safety of some of your exercises. The greatest weight gain will occur in the last 2 trimesters of pregnancy. Exercise will help you:  Control your weight.  Get you in shape for labor and delivery.  Lose weight   after you have the baby.  Wear a good support or jogging bra for breast tenderness during pregnancy. This may help if worn during sleep. Pads or tissues may be used in the bra if you are leaking colostrum.  Do not use hot tubs, steam rooms or saunas throughout the pregnancy.  Wear your seat belt at all times when driving. This protects you and your baby if you are in an accident.  Avoid raw meat, uncooked cheese, cat litter boxes and soil used by cats. These carry germs that can cause birth defects in the baby.  The second trimester is also a good time to visit your dentist for your dental health if this has not been done yet. Getting your teeth cleaned is OK. Use a soft toothbrush. Brush gently during pregnancy.  It is easier to loose urine during pregnancy. Tightening up and strengthening the pelvic muscles will help with this problem. Practice stopping your  urination while you are going to the bathroom. These are the same muscles you need to strengthen. It is also the muscles you would use as if you were trying to stop from passing gas. You can practice tightening these muscles up 10 times a set and repeating this about 3 times per day. Once you know what muscles to tighten up, do not perform these exercises during urination. It is more likely to contribute to an infection by backing up the urine.  Ask for help if you have financial, counseling or nutritional needs during pregnancy. Your caregiver will be able to offer counseling for these needs as well as refer you for other special needs.  Your skin may become oily. If so, wash your face with mild soap, use non-greasy moisturizer and oil or cream based makeup. MEDICATIONS AND DRUG USE IN PREGNANCY  Take prenatal vitamins as directed. The vitamin should contain 1 milligram of folic acid. Keep all vitamins out of reach of children. Only a couple vitamins or tablets containing iron may be fatal to a baby or young child when ingested.  Avoid use of all medications, including herbs, over-the-counter medications, not prescribed or suggested by your caregiver. Only take over-the-counter or prescription medicines for pain, discomfort, or fever as directed by your caregiver. Do not use aspirin.  Let your caregiver also know about herbs you may be using.  Alcohol is related to a number of birth defects. This includes fetal alcohol syndrome. All alcohol, in any form, should be avoided completely. Smoking will cause low birth rate and premature babies.  Street or illegal drugs are very harmful to the baby. They are absolutely forbidden. A baby born to an addicted mother will be addicted at birth. The baby will go through the same withdrawal an adult does. SEEK MEDICAL CARE IF:  You have any concerns or worries during your pregnancy. It is better to call with your questions if you feel they cannot wait, rather  than worry about them. SEEK IMMEDIATE MEDICAL CARE IF:   An unexplained oral temperature above 102 F (38.9 C) develops, or as your caregiver suggests.  You have leaking of fluid from the vagina (birth canal). If leaking membranes are suspected, take your temperature and tell your caregiver of this when you call.  There is vaginal spotting, bleeding, or passing clots. Tell your caregiver of the amount and how many pads are used. Light spotting in pregnancy is common, especially following intercourse.  You develop a bad smelling vaginal discharge with a change in the color from clear   to white.  You continue to feel sick to your stomach (nauseated) and have no relief from remedies suggested. You vomit blood or coffee ground-like materials.  You lose more than 2 pounds of weight or gain more than 2 pounds of weight over 1 week, or as suggested by your caregiver.  You notice swelling of your face, hands, feet, or legs.  You get exposed to German measles and have never had them.  You are exposed to fifth disease or chickenpox.  You develop belly (abdominal) pain. Round ligament discomfort is a common non-cancerous (benign) cause of abdominal pain in pregnancy. Your caregiver still must evaluate you.  You develop a bad headache that does not go away.  You develop fever, diarrhea, pain with urination, or shortness of breath.  You develop visual problems, blurry, or double vision.  You fall or are in a car accident or any kind of trauma.  There is mental or physical violence at home. Document Released: 02/07/2001 Document Revised: 05/08/2011 Document Reviewed: 08/12/2008 [employer] Patient Information 2013 [employer].  

## 2012-04-23 NOTE — Progress Notes (Signed)
Finished antibiotics for STI (trich) and would like to make sure that has cleared up.  Still has some discomfort and discharge.

## 2012-04-23 NOTE — Progress Notes (Signed)
   Subjective:new ob visit    Valerie Reyes is a G1P0 [redacted]w[redacted]d being seen today for her first obstetrical visit.  Her obstetrical history is significant for smoker and late to care. Says she plans to quit smoking. Patient does intend to breast feed. Pregnancy history fully reviewed.  Patient reports no complaints.  Filed Vitals:   04/23/12 1011  BP: 109/66  Temp: 98.3 F (36.8 C)  Weight: 133 lb 1.6 oz (60.374 kg)    HISTORY: OB History   Grav Para Term Preterm Abortions TAB SAB Ect Mult Living   1              # Outc Date GA Lbr Len/2nd Wgt Sex Del Anes PTL Lv   1 CUR              Past Medical History  Diagnosis Date  . Pregnancy   . Bronchitis   . Medical history non-contributory    Past Surgical History  Procedure Laterality Date  . No past surgeries     Family History  Problem Relation Age of Onset  . Cancer Mother   . Diabetes Mother   . Hypertension Mother   . Cancer Maternal Aunt     lung  . Diabetes Maternal Grandmother   . Stroke Maternal Grandmother   . Hypertension Maternal Grandmother   . Hypertension Paternal Grandmother      Exam    Uterus:     Pelvic Exam:    Perineum: No Hemorrhoids   Vulva: normal   Vagina:  curdlike discharge   pH:    Cervix: no lesions   Adnexa: not evaluated   Bony Pelvis: average  System: Breast:  deferred   Skin: normal coloration and turgor, no rashes    Neurologic: oriented, normal mood   Extremities: normal strength, tone, and muscle mass, no deformities   HEENT sclera clear, anicteric, oropharynx clear, no lesions, neck supple with midline trachea and thyroid without masses   Mouth/Teeth mucous membranes moist, pharynx normal without lesions and dental hygiene good   Neck supple and no masses   Cardiovascular: regular rate and rhythm, no murmurs or gallops   Respiratory:  appears well, vitals normal, no respiratory distress, acyanotic, normal RR, neck free of mass or lymphadenopathy, chest clear, no  wheezing, crepitations, rhonchi, normal symmetric air entry   Abdomen: soft, non-tender; bowel sounds normal; no masses,  no organomegaly   Urinary: urethral meatus normal      Assessment:    Pregnancy: G1P0 Patient Active Problem List  Diagnosis  . Late prenatal care        Plan:     Initial labs drawn. Prenatal vitamins. Problem list reviewed and updated. Genetic Screening discussed too late  Ultrasound discussed; fetal survey: ordered.  Follow up in 4 weeks. 50% of 30 min visit spent on counseling and coordination of care.  Quit smoking   Mileigh Tilley 04/23/2012

## 2012-04-24 ENCOUNTER — Ambulatory Visit (HOSPITAL_COMMUNITY)
Admission: RE | Admit: 2012-04-24 | Discharge: 2012-04-24 | Disposition: A | Discharge status: Home / Self Care | Payer: Medicaid Other | Source: Ambulatory Visit | Attending: Obstetrics & Gynecology | Admitting: Obstetrics & Gynecology

## 2012-04-24 DIAGNOSIS — Z1389 Encounter for screening for other disorder: Secondary | ICD-10-CM | POA: Insufficient documentation

## 2012-04-24 DIAGNOSIS — O99330 Smoking (tobacco) complicating pregnancy, unspecified trimester: Secondary | ICD-10-CM | POA: Insufficient documentation

## 2012-04-24 DIAGNOSIS — O358XX0 Maternal care for other (suspected) fetal abnormality and damage, not applicable or unspecified: Secondary | ICD-10-CM | POA: Insufficient documentation

## 2012-04-24 DIAGNOSIS — O0930 Supervision of pregnancy with insufficient antenatal care, unspecified trimester: Secondary | ICD-10-CM | POA: Insufficient documentation

## 2012-04-24 DIAGNOSIS — Z363 Encounter for antenatal screening for malformations: Secondary | ICD-10-CM | POA: Insufficient documentation

## 2012-04-24 DIAGNOSIS — O0932 Supervision of pregnancy with insufficient antenatal care, second trimester: Secondary | ICD-10-CM

## 2012-04-24 LAB — OBSTETRIC PANEL
Antibody Screen: NEGATIVE
Basophils Absolute: 0 10*3/uL (ref 0.0–0.1)
Eosinophils Relative: 6 % — ABNORMAL HIGH (ref 0–5)
HCT: 37.2 % (ref 36.0–46.0)
Lymphocytes Relative: 16 % (ref 12–46)
MCV: 88.8 fL (ref 78.0–100.0)
Monocytes Absolute: 0.7 10*3/uL (ref 0.1–1.0)
RDW: 13.8 % (ref 11.5–15.5)
Rh Type: POSITIVE
Rubella: 13.8 Index — ABNORMAL HIGH (ref <0.90)
WBC: 11.1 10*3/uL — ABNORMAL HIGH (ref 4.0–10.5)

## 2012-04-25 LAB — HEMOGLOBINOPATHY EVALUATION: Hgb F Quant: 0 % (ref 0.0–2.0)

## 2012-05-21 ENCOUNTER — Ambulatory Visit (INDEPENDENT_AMBULATORY_CARE_PROVIDER_SITE_OTHER): Payer: Self-pay | Admitting: Obstetrics & Gynecology

## 2012-05-21 ENCOUNTER — Encounter: Payer: Self-pay | Admitting: Obstetrics & Gynecology

## 2012-05-21 ENCOUNTER — Other Ambulatory Visit: Payer: Self-pay | Admitting: Obstetrics & Gynecology

## 2012-05-21 VITALS — BP 121/70 | Wt 141.6 lb

## 2012-05-21 DIAGNOSIS — O0930 Supervision of pregnancy with insufficient antenatal care, unspecified trimester: Secondary | ICD-10-CM

## 2012-05-21 DIAGNOSIS — B3731 Acute candidiasis of vulva and vagina: Secondary | ICD-10-CM

## 2012-05-21 DIAGNOSIS — Z23 Encounter for immunization: Secondary | ICD-10-CM

## 2012-05-21 DIAGNOSIS — O0932 Supervision of pregnancy with insufficient antenatal care, second trimester: Secondary | ICD-10-CM

## 2012-05-21 DIAGNOSIS — B373 Candidiasis of vulva and vagina: Secondary | ICD-10-CM

## 2012-05-21 LAB — POCT URINALYSIS DIP (DEVICE)
Bilirubin Urine: NEGATIVE
Hgb urine dipstick: NEGATIVE
Ketones, ur: NEGATIVE mg/dL
pH: 6 (ref 5.0–8.0)

## 2012-05-21 MED ORDER — TETANUS-DIPHTH-ACELL PERTUSSIS 5-2.5-18.5 LF-MCG/0.5 IM SUSP
0.5000 mL | Freq: Once | INTRAMUSCULAR | Status: AC
  Administered 2012-05-21: 0.5 mL via INTRAMUSCULAR

## 2012-05-21 MED ORDER — FLUCONAZOLE 150 MG PO TABS: 150.0000 mg | ORAL_TABLET | Freq: Once | ORAL | Status: DC

## 2012-05-21 NOTE — Progress Notes (Signed)
Pulse: 100 Pt thinks she has a yeast infection.

## 2012-05-21 NOTE — Progress Notes (Signed)
Routine OB visit. Good FM. Complains of vaginal discharge (h/o CT and trich). I will send a wet prep. Yeast demonstrated on speculum exam. Script of diflucan e-prescribed. Denies VB, ROM, or CTXs.

## 2012-05-22 LAB — WET PREP, GENITAL
Clue Cells Wet Prep HPF POC: NONE SEEN
Trich, Wet Prep: NONE SEEN

## 2012-06-11 ENCOUNTER — Encounter: Payer: Self-pay | Admitting: Obstetrics & Gynecology

## 2012-06-18 ENCOUNTER — Ambulatory Visit (INDEPENDENT_AMBULATORY_CARE_PROVIDER_SITE_OTHER): Payer: Self-pay | Admitting: Obstetrics & Gynecology

## 2012-06-18 ENCOUNTER — Other Ambulatory Visit: Payer: Self-pay | Admitting: Obstetrics & Gynecology

## 2012-06-18 ENCOUNTER — Encounter: Payer: Self-pay | Admitting: Obstetrics & Gynecology

## 2012-06-18 VITALS — BP 93/64 | Temp 98.8°F | Wt 142.3 lb

## 2012-06-18 DIAGNOSIS — O0930 Supervision of pregnancy with insufficient antenatal care, unspecified trimester: Secondary | ICD-10-CM

## 2012-06-18 LAB — POCT URINALYSIS DIP (DEVICE)
Bilirubin Urine: NEGATIVE
Ketones, ur: NEGATIVE mg/dL
Leukocytes, UA: NEGATIVE
Nitrite: NEGATIVE
Protein, ur: NEGATIVE mg/dL

## 2012-06-18 NOTE — Patient Instructions (Addendum)
Contraception Choices  Contraception (birth control) is the use of any methods or devices to prevent pregnancy. Below are some methods to help avoid pregnancy.  HORMONAL METHODS   · Contraceptive implant. This is a thin, plastic tube containing progesterone hormone. It does not contain estrogen hormone. Your caregiver inserts the tube in the inner part of the upper arm. The tube can remain in place for up to 3 years. After 3 years, the implant must be removed. The implant prevents the ovaries from releasing an egg (ovulation), thickens the cervical mucus which prevents sperm from entering the uterus, and thins the lining of the inside of the uterus.  · Progesterone-only injections. These injections are given every 3 months by your caregiver to prevent pregnancy. This synthetic progesterone hormone stops the ovaries from releasing eggs. It also thickens cervical mucus and changes the uterine lining. This makes it harder for sperm to survive in the uterus.  · Birth control pills. These pills contain estrogen and progesterone hormone. They work by stopping the egg from forming in the ovary (ovulation). Birth control pills are prescribed by a caregiver. Birth control pills can also be used to treat heavy periods.  · Minipill. This type of birth control pill contains only the progesterone hormone. They are taken every day of each month and must be prescribed by your caregiver.  · Birth control patch. The patch contains hormones similar to those in birth control pills. It must be changed once a week and is prescribed by a caregiver.  · Vaginal ring. The ring contains hormones similar to those in birth control pills. It is left in the vagina for 3 weeks, removed for 1 week, and then a new one is put back in place. The patient must be comfortable inserting and removing the ring from the vagina. A caregiver's prescription is necessary.  · Emergency contraception. Emergency contraceptives prevent pregnancy after unprotected  sexual intercourse. This pill can be taken right after sex or up to 5 days after unprotected sex. It is most effective the sooner you take the pills after having sexual intercourse. Emergency contraceptive pills are available without a prescription. Check with your pharmacist. Do not use emergency contraception as your only form of birth control.  BARRIER METHODS   · Female condom. This is a thin sheath (latex or rubber) that is worn over the penis during sexual intercourse. It can be used with spermicide to increase effectiveness.  · Female condom. This is a soft, loose-fitting sheath that is put into the vagina before sexual intercourse.  · Diaphragm. This is a soft, latex, dome-shaped barrier that must be fitted by a caregiver. It is inserted into the vagina, along with a spermicidal jelly. It is inserted before intercourse. The diaphragm should be left in the vagina for 6 to 8 hours after intercourse.  · Cervical cap. This is a round, soft, latex or plastic cup that fits over the cervix and must be fitted by a caregiver. The cap can be left in place for up to 48 hours after intercourse.  · Sponge. This is a soft, circular piece of polyurethane foam. The sponge has spermicide in it. It is inserted into the vagina after wetting it and before sexual intercourse.  · Spermicides. These are chemicals that kill or block sperm from entering the cervix and uterus. They come in the form of creams, jellies, suppositories, foam, or tablets. They do not require a prescription. They are inserted into the vagina with an applicator before having sexual intercourse.   IUD). This is a T-shaped device that is put in a woman's uterus during a menstrual period to prevent pregnancy. There are 2 types:  Copper IUD. This type of IUD is wrapped in copper wire and is placed inside the uterus. Copper makes the uterus and  fallopian tubes produce a fluid that kills sperm. It can stay in place for 10 years.  Hormone IUD. This type of IUD contains the hormone progestin (synthetic progesterone). The hormone thickens the cervical mucus and prevents sperm from entering the uterus, and it also thins the uterine lining to prevent implantation of a fertilized egg. The hormone can weaken or kill the sperm that get into the uterus. It can stay in place for 5 years. PERMANENT METHODS OF CONTRACEPTION  Female tubal ligation. This is when the woman's fallopian tubes are surgically sealed, tied, or blocked to prevent the egg from traveling to the uterus.  Female sterilization. This is when the female has the tubes that carry sperm tied off (vasectomy).This blocks sperm from entering the vagina during sexual intercourse. After the procedure, the man can still ejaculate fluid (semen). NATURAL PLANNING METHODS  Natural family planning. This is not having sexual intercourse or using a barrier method (condom, diaphragm, cervical cap) on days the woman could become pregnant.  Calendar method. This is keeping track of the length of each menstrual cycle and identifying when you are fertile.  Ovulation method. This is avoiding sexual intercourse during ovulation.  Symptothermal method. This is avoiding sexual intercourse during ovulation, using a thermometer and ovulation symptoms.  Post-ovulation method. This is timing sexual intercourse after you have ovulated. Regardless of which type or method of contraception you choose, it is important that you use condoms to protect against the transmission of sexually transmitted diseases (STDs). Talk with your caregiver about which form of contraception is most appropriate for you. Document Released: 02/13/2005 Document Revised: 05/08/2011 Document Reviewed: 06/22/2010 [facility] Patient Information 2013 [place], [place]. Levonorgestrel intrauterine device (IUD) What is this  medicine? LEVONORGESTREL IUD (LEE voe nor jes trel) is a contraceptive (birth control) device. It is used to prevent pregnancy and to treat heavy bleeding that occurs during your period. It can be used for up to 5 years. This medicine may be used for other purposes; ask your health care provider or pharmacist if you have questions. What should I tell my health care provider before I take this medicine? They need to know if you have any of these conditions: -abnormal Pap smear -cancer of the breast, uterus, or cervix -diabetes -endometritis -genital or pelvic infection now or in the past -have more than one sexual partner or your partner has more than one partner -heart disease -history of an ectopic or tubal pregnancy -immune system problems -IUD in place -liver disease or tumor -problems with blood clots or take blood-thinners -use intravenous drugs -uterus of unusual shape -vaginal bleeding that has not been explained -an unusual or allergic reaction to levonorgestrel, other hormones, silicone, or polyethylene, medicines, foods, dyes, or preservatives -pregnant or trying to get pregnant -breast-feeding How should I use this medicine? This device is placed inside the uterus by a health care professional. Talk to your pediatrician regarding the use of this medicine in children. Special care may be needed. Overdosage: If you think you have taken too much of this medicine contact a poison control center or emergency room at once. NOTE: This medicine is only for you. Do not share this medicine with others. What if I miss a dose?  This does not apply. What may interact with this medicine? Do not take this medicine with any of the following medications: -amprenavir -bosentan -fosamprenavir This medicine may also interact with the following medications: -aprepitant -barbiturate medicines for inducing sleep or treating seizures -bexarotene -griseofulvin -medicines to treat seizures  like carbamazepine, ethotoin, felbamate, oxcarbazepine, phenytoin, topiramate -modafinil -pioglitazone -rifabutin -rifampin -rifapentine -some medicines to treat HIV infection like atazanavir, indinavir, lopinavir, nelfinavir, tipranavir, ritonavir -St. John's wort -warfarin This list may not describe all possible interactions. Give your health care provider a list of all the medicines, herbs, non-prescription drugs, or dietary supplements you use. Also tell them if you smoke, drink alcohol, or use illegal drugs. Some items may interact with your medicine. What should I watch for while using this medicine? Visit your doctor or health care professional for regular check ups. See your doctor if you or your partner has sexual contact with others, becomes HIV positive, or gets a sexual transmitted disease. This product does not protect you against HIV infection (AIDS) or other sexually transmitted diseases. You can check the placement of the IUD yourself by reaching up to the top of your vagina with clean fingers to feel the threads. Do not pull on the threads. It is a good habit to check placement after each menstrual period. Call your doctor right away if you feel more of the IUD than just the threads or if you cannot feel the threads at all. The IUD may come out by itself. You may become pregnant if the device comes out. If you notice that the IUD has come out use a backup birth control method like condoms and call your health care provider. Using tampons will not change the position of the IUD and are okay to use during your period. What side effects may I notice from receiving this medicine? Side effects that you should report to your doctor or health care professional as soon as possible: -allergic reactions like skin rash, itching or hives, swelling of the face, lips, or tongue -fever, flu-like symptoms -genital sores -high blood pressure -no menstrual period for 6 weeks during use -pain,  swelling, warmth in the leg -pelvic pain or tenderness -severe or sudden headache -signs of pregnancy -stomach cramping -sudden shortness of breath -trouble with balance, talking, or walking -unusual vaginal bleeding, discharge -yellowing of the eyes or skin Side effects that usually do not require medical attention (report to your doctor or health care professional if they continue or are bothersome): -acne -breast pain -change in sex drive or performance -changes in weight -cramping, dizziness, or faintness while the device is being inserted -headache -irregular menstrual bleeding within first 3 to 6 months of use -nausea This list may not describe all possible side effects. Call your doctor for medical advice about side effects. You may report side effects to FDA at 1-800-FDA-1088. Where should I keep my medicine? This does not apply. NOTE: This sheet is a summary. It may not cover all possible information. If you have questions about this medicine, talk to your doctor, pharmacist, or health care provider.  2012, [employer] Standard. (03/06/2008 6:39:08 PM)Contraceptive Implant Information A contraceptive implant is a plastic rod that is inserted under the skin. It is usually inserted under the skin of your upper arm. It continually releases small amounts of progestin (synthetic progesterone) into the bloodstream. This prevents an egg from being released from the ovary. It also thickens the cervical mucus to prevent sperm from entering the cervix, and it thins the  uterine lining to prevent a fertilized egg from attaching to the uterus. They can be effective for up to 3 years. Implants do not provide protection against sexually transmitted diseases (STDs).  The procedure to insert an implant usually takes about 10 minutes. There may be minor bruising, swelling, and discomfort at the insertion site for a couple days. The implant begins to work within the first day. Other contraceptive  protection should be used for 2 weeks. Follow up with your caregiver to get rechecked as directed. Your caregiver will make sure you are a good candidate for the contraceptive implant. Discuss with your caregiver the possible side effects of the implant ADVANTAGES  It prevents pregnancy for up to 3 years.  It is easily reversible.  It is convenient.  The progestins may protect against uterine and ovarian cancer.  It can be used when breastfeeding.  It can be used by women who cannot take estrogen. DISADVANTAGES  You may have irregular or unplanned vaginal bleeding.  You may develop side effects, including headache, weight gain, acne, breast tenderness, or mood changes.  You may have tissue or nerve damage after insertion (rare).  It may be difficult and uncomfortable to remove.  Certain medications may interfere with the effectiveness of the implants. REMOVAL OF IMPLANT The implant should be removed in 3 years or as directed by your caregiver. The implants effect wears off in a few hours after removal. Your ability to get pregnant (fertility) is restored within a couple of weeks. New implants can be inserted as soon as the old ones are removed if desired. DO NOT GET THE IMPLANT IF:   You are pregnant.  You have a history of breast cancer, osteoporosis, blood clots, heart disease, diabetes, high blood pressure, liver disease, tumors, or stroke.   You have undiagnosed vaginal bleeding.  You have overly sensitive to certain parts of the implant. Document Released: 02/02/2011 Document Revised: 05/08/2011 Document Reviewed: 02/02/2011 [facility] Patient Information 2013 [place], [place].

## 2012-06-18 NOTE — Progress Notes (Signed)
Pulse- 80 

## 2012-06-18 NOTE — Progress Notes (Signed)
Pt with no complaints.  Has not decided on contraception abstinence mostly'- reviewed options with her.  Education sheets given.

## 2012-06-30 ENCOUNTER — Encounter (HOSPITAL_COMMUNITY): Payer: Self-pay

## 2012-06-30 ENCOUNTER — Inpatient Hospital Stay (HOSPITAL_COMMUNITY)
Admission: AD | Admit: 2012-06-30 | Discharge: 2012-06-30 | Disposition: A | Discharge status: Home / Self Care | Payer: Medicaid Other | Source: Ambulatory Visit | Attending: Obstetrics and Gynecology | Admitting: Obstetrics and Gynecology

## 2012-06-30 DIAGNOSIS — K089 Disorder of teeth and supporting structures, unspecified: Secondary | ICD-10-CM

## 2012-06-30 DIAGNOSIS — K044 Acute apical periodontitis of pulpal origin: Secondary | ICD-10-CM

## 2012-06-30 DIAGNOSIS — R51 Headache: Secondary | ICD-10-CM | POA: Insufficient documentation

## 2012-06-30 DIAGNOSIS — K047 Periapical abscess without sinus: Secondary | ICD-10-CM

## 2012-06-30 DIAGNOSIS — O99891 Other specified diseases and conditions complicating pregnancy: Secondary | ICD-10-CM | POA: Insufficient documentation

## 2012-06-30 MED ORDER — HYDROCODONE-ACETAMINOPHEN 5-325 MG PO TABS
2.0000 | ORAL_TABLET | Freq: Once | ORAL | Status: AC
  Administered 2012-06-30: 2 via ORAL
  Filled 2012-06-30: qty 2

## 2012-06-30 MED ORDER — PENICILLIN V POTASSIUM 250 MG PO TABS
250.0000 mg | ORAL_TABLET | Freq: Once | ORAL | Status: AC
  Administered 2012-06-30: 250 mg via ORAL
  Filled 2012-06-30: qty 1

## 2012-06-30 MED ORDER — HYDROCODONE-ACETAMINOPHEN 5-325 MG PO TABS: 1.0000 | ORAL_TABLET | Freq: Four times a day (QID) | ORAL | Status: DC | PRN

## 2012-06-30 MED ORDER — PENICILLIN V POTASSIUM 250 MG PO TABS: 250.0000 mg | ORAL_TABLET | Freq: Four times a day (QID) | ORAL | Status: DC

## 2012-06-30 NOTE — MAU Provider Note (Signed)
  History     CSN: [id number]  Arrival date and time: 06/30/12 2205   First Provider Initiated Contact with Patient 06/30/12 2238      Chief Complaint  Patient presents with  . Dental Pain  . Headache   HPI  Valerie Reyes is a 27 y.o. G1P0000 at [redacted]w[redacted]d who presents today with a toothache. She states it has been hurting for about a week, and she has just found a new dentist. She has an appointment there on 07/07/12. She rates her current pain 9/10. She has a headache from the toothache.   She also states that the baby was not moving as much as normal today, but states he is moving normally now. She denies VB or LOF.  Past Medical History  Diagnosis Date  . Pregnancy   . Bronchitis   . Medical history non-contributory     Past Surgical History  Procedure Laterality Date  . No past surgeries      Family History  Problem Relation Age of Onset  . Cancer Mother   . Diabetes Mother   . Hypertension Mother   . Cancer Maternal Aunt     lung  . Diabetes Maternal Grandmother   . Stroke Maternal Grandmother   . Hypertension Maternal Grandmother   . Hypertension Paternal Grandmother     History  Substance Use Topics  . Smoking status: Former Smoker -- 1.00 packs/day    Types: Cigarettes    Quit date: 04/09/2012  . Smokeless tobacco: Not on file  . Alcohol Use: No    Allergies: No Known Allergies  Prescriptions prior to admission  Medication Sig Dispense Refill  . benzocaine (ORAJEL) 10 % mucosal gel Use as directed 1 application in the mouth or throat as needed for pain.      . Prenatal Vit-Fe Fumarate-FA (PRENATAL COMPLETE) 14-0.4 MG TABS Take 1 tablet by mouth daily.  60 each  3  . fluconazole (DIFLUCAN) 150 MG tablet Take 1 tablet (150 mg total) by mouth once.  1 tablet  1  . ibuprofen (ADVIL,MOTRIN) 800 MG tablet Take 800 mg by mouth 3 (three) times daily. Pain        Review of Systems  Constitutional: Negative for fever.  Eyes: Negative for blurred vision.   Respiratory: Negative for shortness of breath.   Cardiovascular: Negative for chest pain.  Gastrointestinal: Negative for nausea, vomiting, abdominal pain, diarrhea and constipation.  Genitourinary: Negative for dysuria, urgency and frequency.  Musculoskeletal: Negative for myalgias.  Neurological: Positive for headaches.   Physical Exam   Blood pressure 107/73, pulse 102, temperature 97.5 F (36.4 C), temperature source Oral, resp. rate 18, height 5' 1.5" (1.562 m), weight 65.953 kg (145 lb 6.4 oz), last menstrual period 10/22/2011.  Physical Exam  Nursing note and vitals reviewed. Constitutional: She is oriented to person, place, and time. She appears well-developed and well-nourished. No distress.  Cardiovascular: Normal rate.   Respiratory: Effort normal.  Neurological: She is alert and oriented to person, place, and time.  Skin: Skin is warm and dry.  Psychiatric: She has a normal mood and affect.   FHT 145, moderate with 15x15 accels, no decels Toco: no UCs Cat I MAU Course  Procedures    Assessment and Plan   1. Dental infection    RX: PCN vk 250mg  PO QID #39, first dose given here in MAU Vicodin 5/325 1-2 tabs q 6 hours PRN #15, 0 RF   Tawnya Crook 06/30/2012, 10:41 PM

## 2012-06-30 NOTE — MAU Note (Signed)
Toothache since last Tuesday. Facial swelling and headache x 3 days. Has dental appt for this Tuesday, but hasn't been seen for this tooth problem before. Decreased fetal movement x 1 week.

## 2012-07-02 ENCOUNTER — Other Ambulatory Visit: Payer: Self-pay | Admitting: Obstetrics & Gynecology

## 2012-07-02 ENCOUNTER — Ambulatory Visit (INDEPENDENT_AMBULATORY_CARE_PROVIDER_SITE_OTHER): Payer: Medicaid Other | Admitting: Obstetrics & Gynecology

## 2012-07-02 VITALS — BP 109/73 | Wt 143.5 lb

## 2012-07-02 DIAGNOSIS — O0930 Supervision of pregnancy with insufficient antenatal care, unspecified trimester: Secondary | ICD-10-CM

## 2012-07-02 DIAGNOSIS — O0933 Supervision of pregnancy with insufficient antenatal care, third trimester: Secondary | ICD-10-CM

## 2012-07-02 LAB — POCT URINALYSIS DIP (DEVICE)
Protein, ur: NEGATIVE mg/dL
Specific Gravity, Urine: 1.02 (ref 1.005–1.030)
Urobilinogen, UA: 0.2 mg/dL (ref 0.0–1.0)
pH: 6 (ref 5.0–8.0)

## 2012-07-02 MED ORDER — FLUCONAZOLE 150 MG PO TABS: 150.0000 mg | ORAL_TABLET | Freq: Once | ORAL | Status: DC

## 2012-07-02 NOTE — Patient Instructions (Signed)
Pregnancy - Third Trimester The third trimester of pregnancy (the last 3 months) is a period of the most rapid growth for you and your baby. The baby approaches a length of 20 inches and a weight of 6 to 10 pounds. The baby is adding on fat and getting ready for life outside your body. While inside, babies have periods of sleeping and waking, suck their thumbs, and hiccups. You can often feel small contractions of the uterus. This is false labor. It is also called Braxton-Hicks contractions. This is like a practice for labor. The usual problems in this stage of pregnancy include more difficulty breathing, swelling of the hands and feet from water retention, and having to urinate more often because of the uterus and baby pressing on your bladder.  PRENATAL EXAMS  Blood work may continue to be done during prenatal exams. These tests are done to check on your health and the probable health of your baby. Blood work is used to follow your blood levels (hemoglobin). Anemia (low hemoglobin) is common during pregnancy. Iron and vitamins are given to help prevent this. You may also continue to be checked for diabetes. Some of the past blood tests may be done again.  The size of the uterus is measured during each visit. This makes sure your baby is growing properly according to your pregnancy dates.  Your blood pressure is checked every prenatal visit. This is to make sure you are not getting toxemia.  Your urine is checked every prenatal visit for infection, diabetes and protein.  Your weight is checked at each visit. This is done to make sure gains are happening at the suggested rate and that you and your baby are growing normally.  Sometimes, an ultrasound is performed to confirm the position and the proper growth and development of the baby. This is a test done that bounces harmless sound waves off the baby so your caregiver can more accurately determine due dates.  Discuss the type of pain medication and  anesthesia you will have during your labor and delivery.  Discuss the possibility and anesthesia if a Cesarean Section might be necessary.  Inform your caregiver if there is any mental or physical violence at home. Sometimes, a specialized non-stress test, contraction stress test and biophysical profile are done to make sure the baby is not having a problem. Checking the amniotic fluid surrounding the baby is called an amniocentesis. The amniotic fluid is removed by sticking a needle into the belly (abdomen). This is sometimes done near the end of pregnancy if an early delivery is required. In this case, it is done to help make sure the baby's lungs are mature enough for the baby to live outside of the womb. If the lungs are not mature and it is unsafe to deliver the baby, an injection of cortisone medication is given to the mother 1 to 2 days before the delivery. This helps the baby's lungs mature and makes it safer to deliver the baby. CHANGES OCCURING IN THE THIRD TRIMESTER OF PREGNANCY Your body goes through many changes during pregnancy. They vary from person to person. Talk to your caregiver about changes you notice and are concerned about.  During the last trimester, you have probably had an increase in your appetite. It is normal to have cravings for certain foods. This varies from person to person and pregnancy to pregnancy.  You may begin to get stretch marks on your hips, abdomen, and breasts. These are normal changes in the body  during pregnancy. There are no exercises or medications to take which prevent this change.  Constipation may be treated with a stool softener or adding bulk to your diet. Drinking lots of fluids, fiber in vegetables, fruits, and whole grains are helpful.  Exercising is also helpful. If you have been very active up until your pregnancy, most of these activities can be continued during your pregnancy. If you have been less active, it is helpful to start an exercise  program such as walking. Consult your caregiver before starting exercise programs.  Avoid all smoking, alcohol, un-prescribed drugs, herbs and "street drugs" during your pregnancy. These chemicals affect the formation and growth of the baby. Avoid chemicals throughout the pregnancy to ensure the delivery of a healthy infant.  Backache, varicose veins and hemorrhoids may develop or get worse.  You will tire more easily in the third trimester, which is normal.  The baby's movements may be stronger and more often.  You may become short of breath easily.  Your belly button may stick out.  A yellow discharge may leak from your breasts called colostrum.  You may have a bloody mucus discharge. This usually occurs a few days to a week before labor begins. HOME CARE INSTRUCTIONS   Keep your caregiver's appointments. Follow your caregiver's instructions regarding medication use, exercise, and diet.  During pregnancy, you are providing food for you and your baby. Continue to eat regular, well-balanced meals. Choose foods such as meat, fish, milk and other low fat dairy products, vegetables, fruits, and whole-grain breads and cereals. Your caregiver will tell you of the ideal weight gain.  A physical sexual relationship may be continued throughout pregnancy if there are no other problems such as early (premature) leaking of amniotic fluid from the membranes, vaginal bleeding, or belly (abdominal) pain.  Exercise regularly if there are no restrictions. Check with your caregiver if you are unsure of the safety of your exercises. Greater weight gain will occur in the last 2 trimesters of pregnancy. Exercising helps:  Control your weight.  Get you in shape for labor and delivery.  You lose weight after you deliver.  Rest a lot with legs elevated, or as needed for leg cramps or low back pain.  Wear a good support or jogging bra for breast tenderness during pregnancy. This may help if worn during  sleep. Pads or tissues may be used in the bra if you are leaking colostrum.  Do not use hot tubs, steam rooms, or saunas.  Wear your seat belt when driving. This protects you and your baby if you are in an accident.  Avoid raw meat, cat litter boxes and soil used by cats. These carry germs that can cause birth defects in the baby.  It is easier to loose urine during pregnancy. Tightening up and strengthening the pelvic muscles will help with this problem. You can practice stopping your urination while you are going to the bathroom. These are the same muscles you need to strengthen. It is also the muscles you would use if you were trying to stop from passing gas. You can practice tightening these muscles up 10 times a set and repeating this about 3 times per day. Once you know what muscles to tighten up, do not perform these exercises during urination. It is more likely to cause an infection by backing up the urine.  Ask for help if you have financial, counseling or nutritional needs during pregnancy. Your caregiver will be able to offer counseling for these  needs as well as refer you for other special needs.  Make a list of emergency phone numbers and have them available.  Plan on getting help from family or friends when you go home from the hospital.  Make a trial run to the hospital.  Take prenatal classes with the father to understand, practice and ask questions about the labor and delivery.  Prepare the baby's room/nursery.  Do not travel out of the city unless it is absolutely necessary and with the advice of your caregiver.  Wear only low or no heal shoes to have better balance and prevent falling. MEDICATIONS AND DRUG USE IN PREGNANCY  Take prenatal vitamins as directed. The vitamin should contain 1 milligram of folic acid. Keep all vitamins out of reach of children. Only a couple vitamins or tablets containing iron may be fatal to a baby or young child when ingested.  Avoid use  of all medications, including herbs, over-the-counter medications, not prescribed or suggested by your caregiver. Only take over-the-counter or prescription medicines for pain, discomfort, or fever as directed by your caregiver. Do not use aspirin, ibuprofen (Motrin, Advil, Nuprin) or naproxen (Aleve) unless OK'd by your caregiver.  Let your caregiver also know about herbs you may be using.  Alcohol is related to a number of birth defects. This includes fetal alcohol syndrome. All alcohol, in any form, should be avoided completely. Smoking will cause low birth rate and premature babies.  Street/illegal drugs are very harmful to the baby. They are absolutely forbidden. A baby born to an addicted mother will be addicted at birth. The baby will go through the same withdrawal an adult does. SEEK MEDICAL CARE IF: You have any concerns or worries during your pregnancy. It is better to call with your questions if you feel they cannot wait, rather than worry about them. DECISIONS ABOUT CIRCUMCISION You may or may not know the sex of your baby. If you know your baby is a boy, it may be time to think about circumcision. Circumcision is the removal of the foreskin of the penis. This is the skin that covers the sensitive end of the penis. There is no proven medical need for this. Often this decision is made on what is popular at the time or based upon religious beliefs and social issues. You can discuss these issues with your caregiver or pediatrician. SEEK IMMEDIATE MEDICAL CARE IF:   An unexplained oral temperature above 102 F (38.9 C) develops, or as your caregiver suggests.  You have leaking of fluid from the vagina (birth canal). If leaking membranes are suspected, take your temperature and tell your caregiver of this when you call.  There is vaginal spotting, bleeding or passing clots. Tell your caregiver of the amount and how many pads are used.  You develop a bad smelling vaginal discharge with  a change in the color from clear to white.  You develop vomiting that lasts more than 24 hours.  You develop chills or fever.  You develop shortness of breath.  You develop burning on urination.  You loose more than 2 pounds of weight or gain more than 2 pounds of weight or as suggested by your caregiver.  You notice sudden swelling of your face, hands, and feet or legs.  You develop belly (abdominal) pain. Round ligament discomfort is a common non-cancerous (benign) cause of abdominal pain in pregnancy. Your caregiver still must evaluate you.  You develop a severe headache that does not go away.  You develop visual  problems, blurred or double vision.  If you have not felt your baby move for more than 1 hour. If you think the baby is not moving as much as usual, eat something with sugar in it and lie down on your left side for an hour. The baby should move at least 4 to 5 times per hour. Call right away if your baby moves less than that.  You fall, are in a car accident or any kind of trauma.  There is mental or physical violence at home. Document Released: 02/07/2001 Document Revised: 05/08/2011 Document Reviewed: 08/12/2008 [facility] Patient Information 2013 [place], [place].

## 2012-07-02 NOTE — Progress Notes (Signed)
Yeast, d/c, wet prep done. Diflucan 150 mg po

## 2012-07-02 NOTE — Progress Notes (Signed)
Pulse108 Thinks that she may have a yeast infection

## 2012-07-02 NOTE — [facility]U Provider Note (Signed)
Attestation of Attending Supervision of Advanced Practitioner: Evaluation and management procedures were performed by the PA/NP/CNM/OB Fellow under my supervision/collaboration. Chart reviewed and agree with management and plan.  Valerie Reyes V 07/02/2012 3:05 PM

## 2012-07-03 ENCOUNTER — Encounter: Payer: Self-pay | Admitting: *Deleted

## 2012-07-03 LAB — WET PREP, GENITAL

## 2012-07-04 ENCOUNTER — Encounter: Payer: Self-pay | Admitting: Obstetrics & Gynecology

## 2012-07-15 ENCOUNTER — Encounter: Payer: Self-pay | Admitting: Obstetrics & Gynecology

## 2012-07-16 ENCOUNTER — Ambulatory Visit (INDEPENDENT_AMBULATORY_CARE_PROVIDER_SITE_OTHER): Payer: Medicaid Other | Admitting: Obstetrics & Gynecology

## 2012-07-16 ENCOUNTER — Encounter: Payer: Self-pay | Admitting: Obstetrics & Gynecology

## 2012-07-16 VITALS — BP 110/67 | Temp 98.5°F | Wt 145.1 lb

## 2012-07-16 DIAGNOSIS — O0930 Supervision of pregnancy with insufficient antenatal care, unspecified trimester: Secondary | ICD-10-CM

## 2012-07-16 LAB — POCT URINALYSIS DIP (DEVICE)
Glucose, UA: NEGATIVE mg/dL
Hgb urine dipstick: NEGATIVE
Nitrite: NEGATIVE
Protein, ur: NEGATIVE mg/dL
Urobilinogen, UA: 0.2 mg/dL (ref 0.0–1.0)

## 2012-07-16 NOTE — Progress Notes (Signed)
Pt with no complaints GBS and cx next visit

## 2012-07-16 NOTE — Patient Instructions (Addendum)
Group B Streptococcus Infection During Pregnancy  Group B streptococcus (GBS) is a type of bacteria often found in healthy women. GBS usually does not cause any symptoms or harm to healthy adult women, but the bacteria can make a baby very sick if it is passed to the baby during childbirth. GBS is not a sexually transmitted disease (STD). GBS is different from Group A streptococcus, the bacteria that causes "strep throat."  CAUSES   GBS bacteria can be found in the intestinal, reproductive, and urinary tracts of women. It can also be found in the female genital tract, most often in the vagina and rectal areas.   SYMPTOMS   In pregnancy, GBS can be in the following places:   Genital tract without symptoms.   Rectum without symptoms.   Urine with or without symptoms (asymptomatic bacteriuria).   Urinary symptoms can include pain, frequency, urgency, and blood with urination (cystitis).  Pregnant women who are infected with GBS are at increased risk of:   Early (premature) labor and delivery.   Prolonged rupture of the membranes.   Infection in the following places:   Bladder.   Kidneys (pyelonephritis).   Bag of waters or placenta (chorioamnionitis).   Uterus (endometritis) after delivery.  Newborns who are infected with GBS can develop:   Lung infection (pneumonia).   Blood infection (septicemia).   Infection of the lining of the brain and spinal cord (meningitis).  DIAGNOSIS   Diagnosis of GBS infection is made by screening tests done when you are 35 to [redacted] weeks pregnant. The test (culture) is an easy swab of the vagina and rectum. A sample of your urine might also be checked for the bacteria. Talk with your caregiver about a plan for labor if your test shows that you carry the GBS bacteria.  TREATMENT    If results of the culture are positive, showing that GBS is present, you likely will receive treatment with antibiotic medicines during labor. This will help prevent GBS from being passed to your baby. The antibiotics work only if they are given during labor. If treatment is given earlier in pregnancy, the bacteria may regrow and be present during labor. Tell your caregiver if you are allergic to penicillin or other antibiotics. Antibiotics are also given if:    Infection of the membranes (amnionitis) is suspected.   Labor begins or there is rupture of the membranes before 37 weeks of pregnancy and there is a high risk of delivering the baby.   The mother has a past history of giving birth to an infant with GBS infection.   The culture status is unknown (culture not performed or result not available) and there is:   Fever during labor.   Preterm labor (less than 37 weeks of pregnancy).   Prolonged rupture of membranes (18 hours or more).  Treatment of the mother during labor is not recommended when:   A planned cesarean delivery is done and there is no labor or ruptured membranes. This is true even if the mother tested positive for GBS.   There is a negative culture for GBS screening during the pregnancy, regardless of the risk factors during labor.  The infant will receive antibiotics if the infant tested positive for GBS or has signs and symptoms that suggest GBS infection is present. It is not recommended to routinely give antibiotics to infants whose mothers received antibiotic treatment during labor.  HOME CARE INSTRUCTIONS    Take all antibiotics as prescribed by your caregiver.     Only take medicine as directed by your caregiver.   Continue with prenatal visits and care.   Return for follow-up appointments and cultures.   Follow your caregiver's instructions.  SEEK MEDICAL CARE IF:    You have pain with urination.   You have frequent urination.   You have blood in your urine.   SEEK IMMEDIATE MEDICAL CARE IF:   You have a fever.   You have pain in the back, side, or uterus.   You have chills.   You have abdominal swelling (distension) or pain.   You have labor pains (contractions) every 10 minutes or more often.   You are leaking fluid or bleeding from your vagina.   You have pelvic pressure that feels like your baby is pushing down.   You have a low, dull backache.   You have cramps that feel like your period.   You have abdominal cramps with or without diarrhea.   You have repeated vomiting and diarrhea.   You have trouble breathing.   You develop confusion.   You have stiffness of your body or neck.  MAKE SURE YOU:    Understand these instructions.   Will watch your condition.   Will get help right away if you are not doing well or get worse.  Document Released: 05/23/2007 Document Revised: 05/08/2011 Document Reviewed: 06/26/2010  [employer] Patient Information 2013 [employer].

## 2012-07-16 NOTE — Progress Notes (Signed)
Pulse- 92  Pain-side, pelvic

## 2012-07-22 ENCOUNTER — Emergency Department (HOSPITAL_BASED_OUTPATIENT_CLINIC_OR_DEPARTMENT_OTHER)
Admission: EM | Admit: 2012-07-22 | Discharge: 2012-07-22 | Disposition: A | Discharge status: Home / Self Care | Payer: Medicaid Other | Attending: Emergency Medicine | Admitting: Emergency Medicine

## 2012-07-22 ENCOUNTER — Encounter (HOSPITAL_BASED_OUTPATIENT_CLINIC_OR_DEPARTMENT_OTHER): Payer: Self-pay | Admitting: *Deleted

## 2012-07-22 DIAGNOSIS — Z87891 Personal history of nicotine dependence: Secondary | ICD-10-CM | POA: Insufficient documentation

## 2012-07-22 DIAGNOSIS — H9209 Otalgia, unspecified ear: Secondary | ICD-10-CM | POA: Insufficient documentation

## 2012-07-22 DIAGNOSIS — K0889 Other specified disorders of teeth and supporting structures: Secondary | ICD-10-CM

## 2012-07-22 DIAGNOSIS — K029 Dental caries, unspecified: Secondary | ICD-10-CM | POA: Insufficient documentation

## 2012-07-22 DIAGNOSIS — K089 Disorder of teeth and supporting structures, unspecified: Secondary | ICD-10-CM | POA: Insufficient documentation

## 2012-07-22 DIAGNOSIS — R6884 Jaw pain: Secondary | ICD-10-CM | POA: Insufficient documentation

## 2012-07-22 DIAGNOSIS — O9989 Other specified diseases and conditions complicating pregnancy, childbirth and the puerperium: Secondary | ICD-10-CM | POA: Insufficient documentation

## 2012-07-22 DIAGNOSIS — Z8709 Personal history of other diseases of the respiratory system: Secondary | ICD-10-CM | POA: Insufficient documentation

## 2012-07-22 MED ORDER — AMOXICILLIN 500 MG PO CAPS: 500.0000 mg | ORAL_CAPSULE | Freq: Three times a day (TID) | ORAL | Status: DC

## 2012-07-22 NOTE — ED Provider Notes (Signed)
Medical screening examination/treatment/procedure(s) were performed by non-physician practitioner and as supervising physician I was immediately available for consultation/collaboration.   Tarris Delbene B. Bernette Mayers, MD 07/22/12 1610

## 2012-07-22 NOTE — ED Notes (Signed)
Dental pain x 2 days. Has a dental appointment June 14th.

## 2012-07-22 NOTE — ED Provider Notes (Signed)
History     CSN: [id number]  Arrival date & time 07/22/12  1334   First MD Initiated Contact with Patient 07/22/12 1435      Chief Complaint  Patient presents with  . Dental Pain    (Consider location/radiation/quality/duration/timing/severity/associated sxs/prior treatment) HPI Pt is a 27yo female c/o left upper molar pain x2 days that is constant and aching, 10/10, radiating to left ear, worsened with chewing.  Pt is pregnant, 3rd trimester. Has not tried anything for pain because she was unsure what she could take.  Pt has dental appointment June 14th but cannot stand the pain.  Denies fever, n/v/d.    Past Medical History  Diagnosis Date  . Pregnancy   . Bronchitis   . Medical history non-contributory     Past Surgical History  Procedure Laterality Date  . No past surgeries      Family History  Problem Relation Age of Onset  . Cancer Mother   . Diabetes Mother   . Hypertension Mother   . Cancer Maternal Aunt     lung  . Diabetes Maternal Grandmother   . Stroke Maternal Grandmother   . Hypertension Maternal Grandmother   . Hypertension Paternal Grandmother     History  Substance Use Topics  . Smoking status: Former Smoker -- 1.00 packs/day    Types: Cigarettes    Quit date: 04/09/2012  . Smokeless tobacco: Not on file  . Alcohol Use: No    OB History   Grav Para Term Preterm Abortions TAB SAB Ect Mult Living   1 0 0 0 0 0 0 0 0 0       Review of Systems  Constitutional: Negative for fever and chills.  HENT: Positive for ear pain ( left) and dental problem.     Allergies  Review of patient's allergies indicates no known allergies.  Home Medications   Current Outpatient Rx  Name  Route  Sig  Dispense  Refill  . amoxicillin (AMOXIL) 500 MG capsule   Oral   Take 1 capsule (500 mg total) by mouth 3 (three) times daily.   21 capsule   0   . benzocaine (ORAJEL) 10 % mucosal gel   Mouth/Throat   Use as directed 1 application in the mouth or  throat as needed for pain.         . fluconazole (DIFLUCAN) 150 MG tablet   Oral   Take 1 tablet (150 mg total) by mouth once.   1 tablet   0   . HYDROcodone-acetaminophen (NORCO/VICODIN) 5-325 MG per tablet   Oral   Take 1-2 tablets by mouth every 6 (six) hours as needed for pain.   15 tablet   0   . penicillin v potassium (VEETID) 250 MG tablet   Oral   Take 1 tablet (250 mg total) by mouth 4 (four) times daily.   39 tablet   0   . Prenatal Vit-Fe Fumarate-FA (PRENATAL COMPLETE) 14-0.4 MG TABS   Oral   Take 1 tablet by mouth daily.   60 each   3     BP 122/76  Pulse 76  Temp(Src) 98 F (36.7 C) (Oral)  Resp 18  Wt 144 lb (65.318 kg)  BMI 26.77 kg/m2  SpO2 100%  LMP 10/22/2011  Physical Exam  Nursing note and vitals reviewed. Constitutional: She appears well-developed and well-nourished. No distress.  HENT:  Head: Normocephalic and atraumatic. No trismus in the jaw.  Right Ear: Hearing, tympanic membrane, external ear  and ear canal normal.  Left Ear: Hearing, tympanic membrane, external ear and ear canal normal.  Nose: Nose normal.  Mouth/Throat: Uvula is midline, oropharynx is clear and moist and mucous membranes are normal. She does not have dentures. No oral lesions. Abnormal dentition. Dental caries present. No dental abscesses, edematous or lacerations. No oropharyngeal exudate.    Visible dentin on 2nd to last left upper molar.  No abscess visible.  No drainage.  Tender to palpation  Eyes: Conjunctivae are normal. No scleral icterus.  Neck: Normal range of motion.  Cardiovascular: Normal rate, regular rhythm and normal heart sounds.   Pulmonary/Chest: Effort normal and breath sounds normal. No respiratory distress. She has no wheezes. She has no rales. She exhibits no tenderness.  Musculoskeletal: Normal range of motion.  Neurological: She is alert.  Skin: Skin is warm and dry. She is not diaphoretic.  Psychiatric: She has a normal mood and affect.  Her behavior is normal.    ED Course  Procedures (including critical care time)  Labs Reviewed - No data to display No results found.   1. Dental caries into pulp   2. Pain, dental       MDM  Pt has increased dental pain x2 days w/o fever, n/v/d.  Dental appointment June 14th.  Has not had anything for pain, concerned what she could take. PE: mouth-dentin visible on 2nd to last 2nd to last molar.  TTP.  No visible abscess.  Pt is in 3rd trimester, unsure what to take for pain. Rx: ammoxacilin, tylenol for pain.  F/u with Dr. Lucky Cowboy in 24-48hours. Vitals: unremarkable. Provided pt info on dental carries.  Vitals: unremarkable. Discharged in stable condition.    Discussed pt with attending during ED encounter.        Junius Finner, PA-C 07/22/12 1744

## 2012-07-25 ENCOUNTER — Other Ambulatory Visit: Payer: Self-pay | Admitting: Obstetrics & Gynecology

## 2012-07-25 DIAGNOSIS — O0933 Supervision of pregnancy with insufficient antenatal care, third trimester: Secondary | ICD-10-CM

## 2012-07-30 ENCOUNTER — Ambulatory Visit (INDEPENDENT_AMBULATORY_CARE_PROVIDER_SITE_OTHER): Payer: Medicaid Other | Admitting: Obstetrics & Gynecology

## 2012-07-30 VITALS — BP 99/66 | Temp 98.0°F | Wt 146.2 lb

## 2012-07-30 DIAGNOSIS — O0933 Supervision of pregnancy with insufficient antenatal care, third trimester: Secondary | ICD-10-CM

## 2012-07-30 DIAGNOSIS — O0930 Supervision of pregnancy with insufficient antenatal care, unspecified trimester: Secondary | ICD-10-CM

## 2012-07-30 LAB — POCT URINALYSIS DIP (DEVICE)
Bilirubin Urine: NEGATIVE
Glucose, UA: NEGATIVE mg/dL
Nitrite: NEGATIVE
Urobilinogen, UA: 0.2 mg/dL (ref 0.0–1.0)

## 2012-07-30 LAB — OB RESULTS CONSOLE GBS: GBS: POSITIVE

## 2012-07-30 LAB — OB RESULTS CONSOLE GC/CHLAMYDIA
Chlamydia: NEGATIVE
Gonorrhea: NEGATIVE

## 2012-07-30 MED ORDER — TERCONAZOLE 0.4 % VA CREA: 1.0000 | TOPICAL_CREAM | Freq: Every day | VAGINAL | Status: DC

## 2012-07-30 NOTE — Patient Instructions (Signed)
Vaginal Delivery  Your caregiver must first be sure you are in labor. Signs of labor include:   You may pass what is called "the mucus plug" before labor begins. This is a small amount of blood stained mucus.   Regular uterine contractions.   The time between contractions get closer together.   The discomfort and pain gradually gets more intense.   Pains are mostly located in the back.   Pains get worse when walking.   The cervix (the opening of the uterus) becomes thinner (begins to efface) and opens up (dilates).  Once you are in labor and admitted into the hospital or care center, your caregiver will do the following:   A complete physical examination.   Check your vital signs (blood pressure, pulse, temperature and the fetal heart rate).   Do a vaginal examination (using a sterile glove and lubricant) to determine:   The position (presentation) of the baby (head [vertex] or buttock first).   The level (station) of the baby's head in the birth canal.   The effacement and dilatation of the cervix.   You may have your pubic hair shaved and be given an enema depending on your caregiver and the circumstance.   An electronic monitor is usually placed on your abdomen. The monitor follows the length and intensity of the contractions, as well as the baby's heart rate.   Usually, your caregiver will insert an IV in your arm with a bottle of sugar water. This is done as a precaution so that medications can be given to you quickly during labor or delivery.  NORMAL LABOR AND DELIVERY IS DIVIDED UP INTO 3 STAGES:  First Stage  This is when regular contractions begin and the cervix begins to efface and dilate. This stage can last from 3 to 15 hours. The end of the first stage is when the cervix is 100% effaced and 10 centimeters dilated. Pain medications may be given by    Injection (morphine, demerol, etc.)    Regional anesthesia (spinal, caudal or epidural, anesthetics given in different locations of the spine). Paracervical pain medication may be given, which is an injection of and anesthetic on each side of the cervix.  A pregnant woman may request to have "Natural Childbirth" which is not to have any medications or anesthesia during her labor and delivery.  Second Stage  This is when the baby comes down through the birth canal (vagina) and is born. This can take 1 to 4 hours. As the baby's head comes down through the birth canal, you may feel like you are going to have a bowel movement. You will get the urge to bear down and push until the baby is delivered. As the baby's head is being delivered, the caregiver will decide if an episiotomy (a cut in the perineum and vagina area) is needed to prevent tearing of the tissue in this area. The episiotomy is sewn up after the delivery of the baby and placenta. Sometimes a mask with nitrous oxide is given for the mother to breath during the delivery of the baby to help if there is too much pain. The end of Stage 2 is when the baby is fully delivered. Then when the umbilical cord stops pulsating it is clamped and cut.  Third Stage  The third stage begins after the baby is completely delivered and ends after the placenta (afterbirth) is delivered. This usually takes 5 to 30 minutes. After the placenta is delivered, a medication is given   either by intravenous or injection to help contract the uterus and prevent bleeding. The third stage is not painful and pain medication is usually not necessary. If an episiotomy was done, it is repaired at this time.  After the delivery, the mother is watched and monitored closely for 1 to 2 hours to make sure there is no postpartum bleeding (hemorrhage). If there is a lot of bleeding, medication is given to contract the uterus and stop the bleeding.  Document Released: 11/23/2007 Document Revised: 11/08/2011 Document Reviewed: 11/23/2007   [employer] Patient Information 2014 [employer]

## 2012-07-30 NOTE — Progress Notes (Signed)
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

## 2012-07-30 NOTE — Progress Notes (Signed)
Pulse: 89 Thinks she may have a yeast infection.

## 2012-07-31 LAB — WET PREP, GENITAL
Clue Cells Wet Prep HPF POC: NONE SEEN
Trich, Wet Prep: NONE SEEN

## 2012-08-05 LAB — CULTURE, BETA STREP (GROUP B ONLY)

## 2012-08-08 ENCOUNTER — Encounter: Payer: Self-pay | Admitting: Obstetrics and Gynecology

## 2012-08-08 ENCOUNTER — Ambulatory Visit (INDEPENDENT_AMBULATORY_CARE_PROVIDER_SITE_OTHER): Payer: Medicaid Other | Admitting: Obstetrics and Gynecology

## 2012-08-08 VITALS — BP 105/72 | Temp 98.7°F | Wt 147.8 lb

## 2012-08-08 DIAGNOSIS — O0930 Supervision of pregnancy with insufficient antenatal care, unspecified trimester: Secondary | ICD-10-CM

## 2012-08-08 LAB — POCT URINALYSIS DIP (DEVICE)
Bilirubin Urine: NEGATIVE
Glucose, UA: NEGATIVE mg/dL
Ketones, ur: NEGATIVE mg/dL
Nitrite: NEGATIVE

## 2012-08-08 NOTE — Progress Notes (Signed)
Pulse- 114  Pressure-lower abd

## 2012-08-08 NOTE — Patient Instructions (Addendum)

## 2012-08-08 NOTE — Progress Notes (Signed)
Doing well. Good FM. Had breastfeeding class. Plans Depo. S/Sx labor reviewed.

## 2012-08-10 ENCOUNTER — Encounter: Payer: Self-pay | Admitting: Obstetrics & Gynecology

## 2012-08-15 ENCOUNTER — Ambulatory Visit (INDEPENDENT_AMBULATORY_CARE_PROVIDER_SITE_OTHER): Payer: Medicaid Other | Admitting: Obstetrics & Gynecology

## 2012-08-15 VITALS — BP 103/76 | Temp 97.3°F | Wt 147.0 lb

## 2012-08-15 DIAGNOSIS — O0933 Supervision of pregnancy with insufficient antenatal care, third trimester: Secondary | ICD-10-CM

## 2012-08-15 DIAGNOSIS — O0930 Supervision of pregnancy with insufficient antenatal care, unspecified trimester: Secondary | ICD-10-CM

## 2012-08-15 LAB — POCT URINALYSIS DIP (DEVICE)
Bilirubin Urine: NEGATIVE
Glucose, UA: NEGATIVE mg/dL
Ketones, ur: NEGATIVE mg/dL
Leukocytes, UA: NEGATIVE
Protein, ur: NEGATIVE mg/dL
Specific Gravity, Urine: 1.02 (ref 1.005–1.030)

## 2012-08-15 NOTE — Patient Instructions (Signed)
Return to clinic for any obstetric concerns or go to [facility] for evaluation  

## 2012-08-15 NOTE — Progress Notes (Signed)
No other complaints or concerns.  Fetal movement and labor precautions reviewed.  

## 2012-08-15 NOTE — Progress Notes (Signed)
Pulse 118 

## 2012-08-22 ENCOUNTER — Ambulatory Visit (INDEPENDENT_AMBULATORY_CARE_PROVIDER_SITE_OTHER): Payer: Medicaid Other | Admitting: Obstetrics & Gynecology

## 2012-08-22 VITALS — BP 108/76 | Temp 97.0°F | Wt 150.3 lb

## 2012-08-22 DIAGNOSIS — O0930 Supervision of pregnancy with insufficient antenatal care, unspecified trimester: Secondary | ICD-10-CM

## 2012-08-22 DIAGNOSIS — O0933 Supervision of pregnancy with insufficient antenatal care, third trimester: Secondary | ICD-10-CM

## 2012-08-22 LAB — POCT URINALYSIS DIP (DEVICE)
Bilirubin Urine: NEGATIVE
Glucose, UA: NEGATIVE mg/dL
Nitrite: NEGATIVE
Specific Gravity, Urine: 1.02 (ref 1.005–1.030)
Urobilinogen, UA: 0.2 mg/dL (ref 0.0–1.0)

## 2012-08-22 NOTE — Progress Notes (Signed)
Pulse: 115 

## 2012-08-22 NOTE — Patient Instructions (Signed)
Return to clinic for any obstetric concerns or go to [facility] for evaluation  

## 2012-08-22 NOTE — Progress Notes (Signed)
No complaints or concerns.  Fetal movement and labor precautions reviewed. Postdates testing to start next week.

## 2012-08-26 ENCOUNTER — Ambulatory Visit (INDEPENDENT_AMBULATORY_CARE_PROVIDER_SITE_OTHER): Payer: Medicaid Other | Admitting: *Deleted

## 2012-08-26 VITALS — BP 99/66 | Wt 151.9 lb

## 2012-08-26 DIAGNOSIS — O480 Post-term pregnancy: Secondary | ICD-10-CM

## 2012-08-26 NOTE — Progress Notes (Signed)
P=[id number], here for nst

## 2012-08-27 ENCOUNTER — Encounter (HOSPITAL_COMMUNITY): Payer: Self-pay | Admitting: *Deleted

## 2012-08-27 ENCOUNTER — Telehealth (HOSPITAL_COMMUNITY): Payer: Self-pay | Admitting: *Deleted

## 2012-08-27 NOTE — Telephone Encounter (Signed)
Preadmission screen  

## 2012-08-29 ENCOUNTER — Ambulatory Visit (INDEPENDENT_AMBULATORY_CARE_PROVIDER_SITE_OTHER): Payer: Medicaid Other | Admitting: Family Medicine

## 2012-08-29 VITALS — BP 114/72 | Temp 97.8°F | Wt 149.6 lb

## 2012-08-29 DIAGNOSIS — O0933 Supervision of pregnancy with insufficient antenatal care, third trimester: Secondary | ICD-10-CM

## 2012-08-29 DIAGNOSIS — O0930 Supervision of pregnancy with insufficient antenatal care, unspecified trimester: Secondary | ICD-10-CM

## 2012-08-29 DIAGNOSIS — O480 Post-term pregnancy: Secondary | ICD-10-CM

## 2012-08-29 LAB — POCT URINALYSIS DIP (DEVICE)
Bilirubin Urine: NEGATIVE
Glucose, UA: NEGATIVE mg/dL
Hgb urine dipstick: NEGATIVE
Nitrite: NEGATIVE
Specific Gravity, Urine: >=1.03 (ref 1.005–1.030)
Urobilinogen, UA: 1 mg/dL (ref 0.0–1.0)

## 2012-08-29 NOTE — Progress Notes (Signed)
NST reviewed and reactive. Nml AFI-- Discussed with pt. IOL.

## 2012-08-29 NOTE — Progress Notes (Signed)
Pt is scheduled for IOL on 08/31/12

## 2012-08-29 NOTE — Patient Instructions (Addendum)
Having a circumcision done in the hospital costs approximately $480.  This will have to be paid in full prior to circumcision being performed.  There are places to have circumcision done as an outpatient which are cheaper.    Circumcisions      Provider   Phone    Price     ------------------------------------------------------------------------------   [facility]  [phone]  $520 at birth     [facility]   [phone]  $317.20 by 4 wks     [facility]   [phone]  $175 by 2 wks    [facility]   [phone]  $250 by 7 days  Pregnancy - Third Trimester The third trimester of pregnancy (the last 3 months) is a period of the most rapid growth for you and your baby. The baby approaches a length of 20 inches and a weight of 6 to 10 pounds. The baby is adding on fat and getting ready for life outside your body. While inside, babies have periods of sleeping and waking, sucking thumbs, and hiccuping. You can often feel small contractions of the uterus. This is false labor. It is also called Braxton-Hicks contractions. This is like a practice for labor. The usual problems in this stage of pregnancy include more difficulty breathing, swelling of the hands and feet from water retention, and having to urinate more often because of the uterus and baby pressing on your bladder.  PRENATAL EXAMS  Blood work may continue to be done during prenatal exams. These tests are done to check on your health and the probable health of your baby. Blood work is used to follow your blood levels (hemoglobin). Anemia (low hemoglobin) is common during pregnancy. Iron and vitamins are given to help prevent this. You may also continue to be checked for diabetes. Some of the past blood tests may be done again.  The size of the uterus is measured during each visit. This makes sure your baby is growing properly according to your pregnancy dates.  Your blood pressure is checked every prenatal visit. This is to make sure you are not getting  toxemia.  Your urine is checked every prenatal visit for infection, diabetes, and protein.  Your weight is checked at each visit. This is done to make sure gains are happening at the suggested rate and that you and your baby are growing normally.  Sometimes, an ultrasound is performed to confirm the position and the proper growth and development of the baby. This is a test done that bounces harmless sound waves off the baby so your caregiver can more accurately determine a due date.  Discuss the type of pain medicine and anesthesia you will have during your labor and delivery.  Discuss the possibility and anesthesia if a cesarean section might be necessary.  Inform your caregiver if there is any mental or physical violence at home. Sometimes, a specialized non-stress test, contraction stress test, and biophysical profile are done to make sure the baby is not having a problem. Checking the amniotic fluid surrounding the baby is called an amniocentesis. The amniotic fluid is removed by sticking a needle into the belly (abdomen). This is sometimes done near the end of pregnancy if an early delivery is required. In this case, it is done to help make sure the baby's lungs are mature enough for the baby to live outside of the womb. If the lungs are not mature and it is unsafe to deliver the baby, an injection of cortisone medicine is given to the  mother 1 to 2 days before the delivery. This helps the baby's lungs mature and makes it safer to deliver the baby. CHANGES OCCURING IN THE THIRD TRIMESTER OF PREGNANCY Your body goes through many changes during pregnancy. They vary from person to person. Talk to your caregiver about changes you notice and are concerned about.  During the last trimester, you have probably had an increase in your appetite. It is normal to have cravings for certain foods. This varies from person to person and pregnancy to pregnancy.  You may begin to get stretch marks on your  hips, abdomen, and breasts. These are normal changes in the body during pregnancy. There are no exercises or medicines to take which prevent this change.  Constipation may be treated with a stool softener or adding bulk to your diet. Drinking lots of fluids, fiber in vegetables, fruits, and whole grains are helpful.  Exercising is also helpful. If you have been very active up until your pregnancy, most of these activities can be continued during your pregnancy. If you have been less active, it is helpful to start an exercise program such as walking. Consult your caregiver before starting exercise programs.  Avoid all smoking, alcohol, non-prescribed drugs, herbs and "street drugs" during your pregnancy. These chemicals affect the formation and growth of the baby. Avoid chemicals throughout the pregnancy to ensure the delivery of a healthy infant.  Backache, varicose veins, and hemorrhoids may develop or get worse.  You will tire more easily in the third trimester, which is normal.  The baby's movements may be stronger and more often.  You may become short of breath easily.  Your belly button may stick out.  A yellow discharge may leak from your breasts called colostrum.  You may have a bloody mucus discharge. This usually occurs a few days to a week before labor begins. HOME CARE INSTRUCTIONS   Keep your caregiver's appointments. Follow your caregiver's instructions regarding medicine use, exercise, and diet.  During pregnancy, you are providing food for you and your baby. Continue to eat regular, well-balanced meals. Choose foods such as meat, fish, milk and other low fat dairy products, vegetables, fruits, and whole-grain breads and cereals. Your caregiver will tell you of the ideal weight gain.  A physical sexual relationship may be continued throughout pregnancy if there are no other problems such as early (premature) leaking of amniotic fluid from the membranes, vaginal bleeding, or  belly (abdominal) pain.  Exercise regularly if there are no restrictions. Check with your caregiver if you are unsure of the safety of your exercises. Greater weight gain will occur in the last 2 trimesters of pregnancy. Exercising helps:  Control your weight.  Get you in shape for labor and delivery.  You lose weight after you deliver.  Rest a lot with legs elevated, or as needed for leg cramps or low back pain.  Wear a good support or jogging bra for breast tenderness during pregnancy. This may help if worn during sleep. Pads or tissues may be used in the bra if you are leaking colostrum.  Do not use hot tubs, steam rooms, or saunas.  Wear your seat belt when driving. This protects you and your baby if you are in an accident.  Avoid raw meat, cat litter boxes and soil used by cats. These carry germs that can cause birth defects in the baby.  It is easier to leak urine during pregnancy. Tightening up and strengthening the pelvic muscles will help with this problem.  You can practice stopping your urination while you are going to the bathroom. These are the same muscles you need to strengthen. It is also the muscles you would use if you were trying to stop from passing gas. You can practice tightening these muscles up 10 times a set and repeating this about 3 times per day. Once you know what muscles to tighten up, do not perform these exercises during urination. It is more likely to cause an infection by backing up the urine.  Ask for help if you have financial, counseling, or nutritional needs during pregnancy. Your caregiver will be able to offer counseling for these needs as well as refer you for other special needs.  Make a list of emergency phone numbers and have them available.  Plan on getting help from family or friends when you go home from the hospital.  Make a trial run to the hospital.  Take prenatal classes with the father to understand, practice, and ask questions about  the labor and delivery.  Prepare the baby's room or nursery.  Do not travel out of the city unless it is absolutely necessary and with the advice of your caregiver.  Wear only low or no heal shoes to have better balance and prevent falling. MEDICINES AND DRUG USE IN PREGNANCY  Take prenatal vitamins as directed. The vitamin should contain 1 milligram of folic acid. Keep all vitamins out of reach of children. Only a couple vitamins or tablets containing iron may be fatal to a baby or young child when ingested.  Avoid use of all medicines, including herbs, over-the-counter medicines, not prescribed or suggested by your caregiver. Only take over-the-counter or prescription medicines for pain, discomfort, or fever as directed by your caregiver. Do not use aspirin, ibuprofen or naproxen unless approved by your caregiver.  Let your caregiver also know about herbs you may be using.  Alcohol is related to a number of birth defects. This includes fetal alcohol syndrome. All alcohol, in any form, should be avoided completely. Smoking will cause low birth rate and premature babies.  Illegal drugs are very harmful to the baby. They are absolutely forbidden. A baby born to an addicted mother will be addicted at birth. The baby will go through the same withdrawal an adult does. SEEK MEDICAL CARE IF: You have any concerns or worries during your pregnancy. It is better to call with your questions if you feel they cannot wait, rather than worry about them. SEEK IMMEDIATE MEDICAL CARE IF:   An unexplained oral temperature above 102 F (38.9 C) develops, or as your caregiver suggests.  You have leaking of fluid from the vagina. If leaking membranes are suspected, take your temperature and tell your caregiver of this when you call.  There is vaginal spotting, bleeding or passing clots. Tell your caregiver of the amount and how many pads are used.  You develop a bad smelling vaginal discharge with a change  in the color from clear to white.  You develop vomiting that lasts more than 24 hours.  You develop chills or fever.  You develop shortness of breath.  You develop burning on urination.  You loose more than 2 pounds of weight or gain more than 2 pounds of weight or as suggested by your caregiver.  You notice sudden swelling of your face, hands, and feet or legs.  You develop belly (abdominal) pain. Round ligament discomfort is a common non-cancerous (benign) cause of abdominal pain in pregnancy. Your caregiver still must evaluate  you.  You develop a severe headache that does not go away.  You develop visual problems, blurred or double vision.  If you have not felt your baby move for more than 1 hour. If you think the baby is not moving as much as usual, eat something with sugar in it and lie down on your left side for an hour. The baby should move at least 4 to 5 times per hour. Call right away if your baby moves less than that.  You fall, are in a car accident, or any kind of trauma.  There is mental or physical violence at home. Document Released: 02/07/2001 Document Revised: 11/08/2011 Document Reviewed: 08/12/2008 [facility] Patient Information 2014 [place], [place].  Contraception Choices Contraception (birth control) is the use of any methods or devices to prevent pregnancy. Below are some methods to help avoid pregnancy. HORMONAL METHODS   Contraceptive implant. This is a thin, plastic tube containing progesterone hormone. It does not contain estrogen hormone. Your caregiver inserts the tube in the inner part of the upper arm. The tube can remain in place for up to 3 years. After 3 years, the implant must be removed. The implant prevents the ovaries from releasing an egg (ovulation), thickens the cervical mucus which prevents sperm from entering the uterus, and thins the lining of the inside of the uterus.  Progesterone-only injections. These injections are given every 3 months  by your caregiver to prevent pregnancy. This synthetic progesterone hormone stops the ovaries from releasing eggs. It also thickens cervical mucus and changes the uterine lining. This makes it harder for sperm to survive in the uterus.  Birth control pills. These pills contain estrogen and progesterone hormone. They work by stopping the egg from forming in the ovary (ovulation). Birth control pills are prescribed by a caregiver.Birth control pills can also be used to treat heavy periods.  Minipill. This type of birth control pill contains only the progesterone hormone. They are taken every day of each month and must be prescribed by your caregiver.  Birth control patch. The patch contains hormones similar to those in birth control pills. It must be changed once a week and is prescribed by a caregiver.  Vaginal ring. The ring contains hormones similar to those in birth control pills. It is left in the vagina for 3 weeks, removed for 1 week, and then a new one is put back in place. The patient must be comfortable inserting and removing the ring from the vagina.A caregiver's prescription is necessary.  Emergency contraception. Emergency contraceptives prevent pregnancy after unprotected sexual intercourse. This pill can be taken right after sex or up to 5 days after unprotected sex. It is most effective the sooner you take the pills after having sexual intercourse. Emergency contraceptive pills are available without a prescription. Check with your pharmacist. Do not use emergency contraception as your only form of birth control. BARRIER METHODS   Female condom. This is a thin sheath (latex or rubber) that is worn over the penis during sexual intercourse. It can be used with spermicide to increase effectiveness.  Female condom. This is a soft, loose-fitting sheath that is put into the vagina before sexual intercourse.  Diaphragm. This is a soft, latex, dome-shaped barrier that must be fitted by a  caregiver. It is inserted into the vagina, along with a spermicidal jelly. It is inserted before intercourse. The diaphragm should be left in the vagina for 6 to 8 hours after intercourse.  Cervical cap. This is a round,  soft, latex or plastic cup that fits over the cervix and must be fitted by a caregiver. The cap can be left in place for up to 48 hours after intercourse.  Sponge. This is a soft, circular piece of polyurethane foam. The sponge has spermicide in it. It is inserted into the vagina after wetting it and before sexual intercourse.  Spermicides. These are chemicals that kill or block sperm from entering the cervix and uterus. They come in the form of creams, jellies, suppositories, foam, or tablets. They do not require a prescription. They are inserted into the vagina with an applicator before having sexual intercourse. The process must be repeated every time you have sexual intercourse. INTRAUTERINE CONTRACEPTION  Intrauterine device (IUD). This is a T-shaped device that is put in a woman's uterus during a menstrual period to prevent pregnancy. There are 2 types:  Copper IUD. This type of IUD is wrapped in copper wire and is placed inside the uterus. Copper makes the uterus and fallopian tubes produce a fluid that kills sperm. It can stay in place for 10 years.  Hormone IUD. This type of IUD contains the hormone progestin (synthetic progesterone). The hormone thickens the cervical mucus and prevents sperm from entering the uterus, and it also thins the uterine lining to prevent implantation of a fertilized egg. The hormone can weaken or kill the sperm that get into the uterus. It can stay in place for 5 years. PERMANENT METHODS OF CONTRACEPTION  Female tubal ligation. This is when the woman's fallopian tubes are surgically sealed, tied, or blocked to prevent the egg from traveling to the uterus.  Female sterilization. This is when the female has the tubes that carry sperm tied off  (vasectomy).This blocks sperm from entering the vagina during sexual intercourse. After the procedure, the man can still ejaculate fluid (semen). NATURAL PLANNING METHODS  Natural family planning. This is not having sexual intercourse or using a barrier method (condom, diaphragm, cervical cap) on days the woman could become pregnant.  Calendar method. This is keeping track of the length of each menstrual cycle and identifying when you are fertile.  Ovulation method. This is avoiding sexual intercourse during ovulation.  Symptothermal method. This is avoiding sexual intercourse during ovulation, using a thermometer and ovulation symptoms.  Post-ovulation method. This is timing sexual intercourse after you have ovulated. Regardless of which type or method of contraception you choose, it is important that you use condoms to protect against the transmission of sexually transmitted diseases (STDs). Talk with your caregiver about which form of contraception is most appropriate for you. Document Released: 02/13/2005 Document Revised: 05/08/2011 Document Reviewed: 06/22/2010 [facility] Patient Information 2014 [place], [place].  Breastfeeding A change in hormones during your pregnancy causes growth of your breast tissue and an increase in number and size of milk ducts. The hormone prolactin allows proteins, sugars, and fats from your blood supply to make breast milk in your milk-producing glands. The hormone progesterone prevents breast milk from being released before the birth of your baby. After the birth of your baby, your progesterone level decreases allowing breast milk to be released. Thoughts of your baby, as well as his or her sucking or crying, can stimulate the release of milk from the milk-producing glands. Deciding to breastfeed (nurse) is one of the best choices you can make for you and your baby. The information that follows gives a brief review of the benefits, as well as other important  skills to know about breastfeeding. BENEFITS OF BREASTFEEDING  For your baby  The first milk (colostrum) helps your baby's digestive system function better.   There are antibodies in your milk that help your baby fight off infections.   Your baby has a lower incidence of asthma, allergies, and sudden infant death syndrome (SIDS).   The nutrients in breast milk are better for your baby than infant formulas.  Breast milk improves your baby's brain development.   Your baby will have less gas, colic, and constipation.  Your baby is less likely to develop other conditions, such as childhood obesity, asthma, or diabetes mellitus. For you  Breastfeeding helps develop a very special bond between you and your baby.   Breastfeeding is convenient, always available at the correct temperature, and costs nothing.   Breastfeeding helps to burn calories and helps you lose the weight gained during pregnancy.   Breastfeeding makes your uterus contract back down to normal size faster and slows bleeding following delivery.   Breastfeeding mothers have a lower risk of developing osteoporosis or breast or ovarian cancer later in life.  BREASTFEEDING FREQUENCY  A healthy, full-term baby may breastfeed as often as every hour or space his or her feedings to every 3 hours. Breastfeeding frequency will vary from baby to baby.   Newborns should be fed no less than every 2 3 hours during the day and every 4 5 hours during the night. You should breastfeed a minimum of 8 feedings in a 24 hour period.  Awaken your baby to breastfeed if it has been 3 4 hours since the last feeding.  Breastfeed when you feel the need to reduce the fullness of your breasts or when your newborn shows signs of hunger. Signs that your baby may be hungry include:  Increased alertness or activity.  Stretching.  Movement of the head from side to side.  Movement of the head and opening of the mouth when the corner of the  mouth or cheek is stroked (rooting).  Increased sucking sounds, smacking lips, cooing, sighing, or squeaking.  Hand-to-mouth movements.  Increased sucking of fingers or hands.  Fussing.  Intermittent crying.  Signs of extreme hunger will require calming and consoling before you try to feed your baby. Signs of extreme hunger may include:  Restlessness.  A loud, strong cry.  Screaming.  Frequent feeding will help you make more milk and will help prevent problems, such as sore nipples and engorgement of the breasts.  BREASTFEEDING   Whether lying down or sitting, be sure that the baby's abdomen is facing your abdomen.   Support your breast with 4 fingers under your breast and your thumb above your nipple. Make sure your fingers are well away from your nipple and your baby's mouth.   Stroke your baby's lips gently with your finger or nipple.   When your baby's mouth is open wide enough, place all of your nipple and as much of the colored area around your nipple (areola) as possible into your baby's mouth.  More areola should be visible above his or her upper lip than below his or her lower lip.  Your baby's tongue should be between his or her lower gum and your breast.  Ensure that your baby's mouth is correctly positioned around the nipple (latched). Your baby's lips should create a seal on your breast.  Signs that your baby has effectively latched onto your nipple include:  Tugging or sucking without pain.  Swallowing heard between sucks.  Absent click or smacking sound.  Muscle movement above  and in front of his or her ears with sucking.  Your baby must suck about 2 3 minutes in order to get your milk. Allow your baby to feed on each breast as long as he or she wants. Nurse your baby until he or she unlatches or falls asleep at the first breast, then offer the second breast.  Signs that your baby is full and satisfied include:  A gradual decrease in the number of  sucks or complete cessation of sucking.  Falling asleep.  Extension or relaxation of his or her body.  Retention of a small amount of milk in his or her mouth.  Letting go of your breast by himself or herself.  Signs of effective breastfeeding in you include:  Breasts that have increased firmness, weight, and size prior to feeding.  Breasts that are softer after nursing.  Increased milk volume, as well as a change in milk consistency and color by the 5th day of breastfeeding.  Breast fullness relieved by breastfeeding.  Nipples are not sore, cracked, or bleeding.  If needed, break the suction by putting your finger into the corner of your baby's mouth and sliding your finger between his or her gums. Then, remove your breast from his or her mouth.  It is common for babies to spit up a small amount after a feeding.  Babies often swallow air during feeding. This can make babies fussy. Burping your baby between breasts can help with this.  Vitamin D supplements are recommended for babies who get only breast milk.  Avoid using a pacifier during your baby's first 4 6 weeks.  Avoid supplemental feedings of water, formula, or juice in place of breastfeeding. Breast milk is all the food your baby needs. It is not necessary for your baby to have water or formula. Your breasts will make more milk if supplemental feedings are avoided during the early weeks. HOW TO TELL WHETHER YOUR BABY IS GETTING ENOUGH BREAST MILK Wondering whether or not your baby is getting enough milk is a common concern among mothers. You can be assured that your baby is getting enough milk if:   Your baby is actively sucking and you hear swallowing.   Your baby seems relaxed and satisfied after a feeding.   Your baby nurses at least 8 12 times in a 24 hour time period.  During the first 71 67 days of age:  Your baby is wetting at least 3 5 diapers in a 24 hour period. The urine should be clear and pale  yellow.  Your baby is having at least 3 4 stools in a 24 hour period. The stool should be soft and yellow.  At 11 59 days of age, your baby is having at least 3 6 stools in a 24 hour period. The stool should be seedy and yellow by 23 days of age.  Your baby has a weight loss less than 7 10% during the first 30 days of age.  Your baby does not lose weight after 26 73 days of age.  Your baby gains 4 7 ounces each week after he or she is 64 days of age.  Your baby gains weight by 41 days of age and is back to birth weight within 2 weeks. ENGORGEMENT In the first week after your baby is born, you may experience extremely full breasts (engorgement). When engorged, your breasts may feel heavy, warm, or tender to the touch. Engorgement peaks within 24 48 hours after delivery of your baby.  Engorgement may be reduced by:  Continuing to breastfeed.  Increasing the frequency of breastfeeding.  Taking warm showers or applying warm, moist heat to your breasts just before each feeding. This increases circulation and helps the milk flow.   Gently massaging your breast before and during the feedings. With your fingertips, massage from your chest wall towards your nipple in a circular motion.   Ensuring that your baby empties at least one breast at every feeding. It also helps to start the next feeding on the opposite breast.   Expressing breast milk by hand or by using a breast pump to empty the breasts if your baby is sleepy, or not nursing well. You may also want to express milk if you are returning to work oryou feel you are getting engorged.  Ensuring your baby is latched on and positioned properly while breastfeeding. If you follow these suggestions, your engorgement should improve in 24 48 hours. If you are still experiencing difficulty, call your lactation consultant or caregiver.  CARING FOR YOURSELF Take care of your breasts.  Bathe or shower daily.   Avoid using soap on your nipples.    Wear a supportive bra. Avoid wearing underwire style bras.  Air dry your nipples for a 3 after each feeding.   Use only cotton bra pads to absorb breast milk leakage. Leaking of breast milk between feedings is normal.   Use only pure lanolin on your nipples after nursing. You do not need to wash it off before feeding your baby again. Another option is to express a few drops of breast milk and gently massage that milk into your nipples.  Continue breast self-awareness checks. Take care of yourself.  Eat healthy foods. Alternate 3 meals with 3 snacks.  Avoid foods that you notice affect your baby in a bad way.  Drink milk, fruit juice, and water to satisfy your thirst (about 8 glasses a day).   Rest often, relax, and take your prenatal vitamins to prevent fatigue, stress, and anemia.  Avoid chewing and smoking tobacco.  Avoid alcohol and drug use.  Take over-the-counter and prescribed medicine only as directed by your caregiver or pharmacist. You should always check with your caregiver or pharmacist before taking any new medicine, vitamin, or herbal supplement.  Know that pregnancy is possible while breastfeeding. If desired, talk to your caregiver about family planning and safe birth control methods that may be used while breastfeeding. SEEK MEDICAL CARE IF:   You feel like you want to stop breastfeeding or have become frustrated with breastfeeding.  You have painful breasts or nipples.  Your nipples are cracked or bleeding.  Your breasts are red, tender, or warm.  You have a swollen area on either breast.  You have a fever or chills.  You have nausea or vomiting.  You have drainage from your nipples.  Your breasts do not become full before feedings by the 5th day after delivery.  You feel sad and depressed.  Your baby is too sleepy to eat well.  Your baby is having trouble sleeping.   Your baby is wetting less than 3 diapers in a 24 hour  period.  Your baby has less than 3 stools in a 24 hour period.  Your baby's skin or the white part of his or her eyes becomes more yellow.   Your baby is not gaining weight by 3 days of age. MAKE SURE YOU:   Understand these instructions.  Will watch your condition.  Will get  help right away if you are not doing well or get worse. Document Released: 02/13/2005 Document Revised: 11/08/2011 Document Reviewed: 09/20/2011 [facility] Patient Information 2014 [employer], [place].

## 2012-08-29 NOTE — Progress Notes (Signed)
P=112,

## 2012-08-30 NOTE — Progress Notes (Signed)
NST reactive on 08/26/12 

## 2012-08-31 ENCOUNTER — Encounter (HOSPITAL_COMMUNITY): Payer: Self-pay

## 2012-08-31 ENCOUNTER — Inpatient Hospital Stay (HOSPITAL_COMMUNITY)
Admission: RE | Admit: 2012-08-31 | Discharge: 2012-09-04 | DRG: 765 | Disposition: A | Discharge status: Home / Self Care | Payer: Medicaid Other | Source: Ambulatory Visit | Attending: Obstetrics & Gynecology | Admitting: Obstetrics & Gynecology

## 2012-08-31 DIAGNOSIS — O411090 Infection of amniotic sac and membranes, unspecified, unspecified trimester, not applicable or unspecified: Secondary | ICD-10-CM | POA: Diagnosis present

## 2012-08-31 DIAGNOSIS — O99892 Other specified diseases and conditions complicating childbirth: Secondary | ICD-10-CM | POA: Diagnosis present

## 2012-08-31 DIAGNOSIS — O480 Post-term pregnancy: Principal | ICD-10-CM | POA: Diagnosis present

## 2012-08-31 DIAGNOSIS — Z22330 Carrier of Group B streptococcus: Secondary | ICD-10-CM

## 2012-08-31 LAB — CBC
HCT: 42 % (ref 36.0–46.0)
Hemoglobin: 14.2 g/dL (ref 12.0–15.0)
MCH: 30.5 pg (ref 26.0–34.0)
MCHC: 33.8 g/dL (ref 30.0–36.0)
RBC: 4.65 MIL/uL (ref 3.87–5.11)

## 2012-08-31 LAB — TYPE AND SCREEN
ABO/RH(D): O POS
Antibody Screen: NEGATIVE

## 2012-08-31 MED ORDER — LACTATED RINGERS IV SOLN
INTRAVENOUS | Status: DC
  Administered 2012-08-31 – 2012-09-01 (×4): via INTRAVENOUS

## 2012-08-31 MED ORDER — LIDOCAINE HCL (PF) 1 % IJ SOLN
30.0000 mL | INTRAMUSCULAR | Status: DC | PRN
  Filled 2012-08-31: qty 30

## 2012-08-31 MED ORDER — OXYTOCIN BOLUS FROM INFUSION: 500.0000 mL | INTRAVENOUS | Status: DC

## 2012-08-31 MED ORDER — ZOLPIDEM TARTRATE 5 MG PO TABS
5.0000 mg | ORAL_TABLET | Freq: Once | ORAL | Status: AC
  Administered 2012-08-31: 5 mg via ORAL
  Filled 2012-08-31: qty 1

## 2012-08-31 MED ORDER — LACTATED RINGERS IV SOLN: 500.0000 mL | INTRAVENOUS | Status: DC | PRN

## 2012-08-31 MED ORDER — CITRIC ACID-SODIUM CITRATE 334-500 MG/5ML PO SOLN
30.0000 mL | ORAL | Status: DC | PRN
  Administered 2012-09-01: 30 mL via ORAL
  Filled 2012-08-31: qty 15

## 2012-08-31 MED ORDER — PENICILLIN G POTASSIUM 5000000 UNITS IJ SOLR
5.0000 10*6.[IU] | Freq: Once | INTRAVENOUS | Status: DC
  Filled 2012-08-31: qty 5

## 2012-08-31 MED ORDER — FLEET ENEMA 7-19 GM/118ML RE ENEM: 1.0000 | ENEMA | RECTAL | Status: DC | PRN

## 2012-08-31 MED ORDER — HYDROXYZINE HCL 50 MG PO TABS: 50.0000 mg | ORAL_TABLET | Freq: Four times a day (QID) | ORAL | Status: DC | PRN

## 2012-08-31 MED ORDER — ACETAMINOPHEN 325 MG PO TABS
650.0000 mg | ORAL_TABLET | ORAL | Status: DC | PRN
  Administered 2012-09-01: 650 mg via ORAL
  Filled 2012-08-31: qty 2

## 2012-08-31 MED ORDER — OXYTOCIN 40 UNITS IN LACTATED RINGERS INFUSION - SIMPLE MED
62.5000 mL/h | INTRAVENOUS | Status: DC
  Filled 2012-08-31: qty 1000

## 2012-08-31 MED ORDER — OXYCODONE-ACETAMINOPHEN 5-325 MG PO TABS: 1.0000 | ORAL_TABLET | ORAL | Status: DC | PRN

## 2012-08-31 MED ORDER — FENTANYL CITRATE 0.05 MG/ML IJ SOLN
100.0000 ug | INTRAMUSCULAR | Status: DC | PRN
  Administered 2012-09-01 (×3): 100 ug via INTRAVENOUS
  Filled 2012-08-31 (×4): qty 2

## 2012-08-31 MED ORDER — ONDANSETRON HCL 4 MG/2ML IJ SOLN: 4.0000 mg | Freq: Four times a day (QID) | INTRAMUSCULAR | Status: DC | PRN

## 2012-08-31 MED ORDER — PENICILLIN G POTASSIUM 5000000 UNITS IJ SOLR
2.5000 10*6.[IU] | INTRAMUSCULAR | Status: DC
  Administered 2012-09-01 (×2): 2.5 10*6.[IU] via INTRAVENOUS
  Filled 2012-08-31 (×8): qty 2.5

## 2012-08-31 MED ORDER — IBUPROFEN 600 MG PO TABS: 600.0000 mg | ORAL_TABLET | Freq: Four times a day (QID) | ORAL | Status: DC | PRN

## 2012-08-31 NOTE — Progress Notes (Signed)
Foley Bulb placed by K. Booker,CNM.  Pt tolerates procedure well.

## 2012-08-31 NOTE — H&P (Signed)
Valerie Reyes is a 27 y.o. G1P0000 female at [redacted]w[redacted]d by 8.6wk u/s, presenting for IOL d/t postdates.  Initiated pnc @ [facility] @ 22.3, too late for genetic screening, 1hr glucola 94, anatomy scan normal, gbs pos. Reports good fm. Denies vb, lof, or uc's.   Maternal Medical History:  Reason for admission: IOL postdates  Fetal activity: Perceived fetal activity is normal.   Last perceived fetal movement was within the past hour.    Prenatal complications: no prenatal complications Prenatal Complications - Diabetes: none.    OB History   Grav Para Term Preterm Abortions TAB SAB Ect Mult Living   1 0 0 0 0 0 0 0 0 0      Past Medical History  Diagnosis Date  . Pregnancy   . Bronchitis   . Medical history non-contributory    Past Surgical History  Procedure Laterality Date  . No past surgeries     Family History: family history includes Cancer in her maternal aunt and mother; Diabetes in her maternal grandmother, mother, and paternal grandmother; Hypertension in her maternal grandmother, mother, and paternal grandmother; and Stroke in her maternal grandmother. Social History:  reports that she quit smoking about 4 months ago. Her smoking use included Cigarettes. She smoked 1.00 pack per day. She does not have any smokeless tobacco history on file. She reports that she does not drink alcohol or use illicit drugs.  Review of Systems  Constitutional: Negative.   HENT: Negative.   Eyes: Negative.   Respiratory: Negative.   Cardiovascular: Negative.   Gastrointestinal: Negative.   Genitourinary: Negative.   Musculoskeletal: Negative.   Skin: Negative.   Neurological: Negative.   Endo/Heme/Allergies: Negative.   Psychiatric/Behavioral: Negative.     Dilation: 1.5 Effacement (%): 70 Station: -2 Exam by:: k Nathaneal Sommers,CNM Blood pressure 113/76, pulse 115, temperature 98.7 F (37.1 C), temperature source Oral, resp. rate 18, height 5' 1.5" (1.562 m), weight 68.04 kg (150 lb), last  menstrual period 10/22/2011. Maternal Exam:  Uterine Assessment: none  Abdomen: Patient reports no abdominal tenderness. Fetal presentation: vertex  Introitus: Normal vulva. Normal vagina.  Pelvis: adequate for delivery.   Cervix: Cervix evaluated by digital exam.     Fetal Exam Fetal Monitor Review: Mode: ultrasound.   Baseline rate: 145.  Variability: moderate (6-25 bpm).   Pattern: accelerations present and no decelerations.    Fetal State Assessment: Category I - tracings are normal.     Physical Exam  Constitutional: She is oriented to person, place, and time. She appears well-developed and well-nourished.  HENT:  Head: Normocephalic.  Neck: Normal range of motion.  Cardiovascular: Normal rate and regular rhythm.   Respiratory: Effort normal and breath sounds normal.  GI: Soft.  gravid  Genitourinary: Vagina normal and uterus normal.  SVE: 1.5/70/-2, vtx Cervical foley bulb inserted and inflated w/ 60ml LR w/o difficulty   Musculoskeletal: Normal range of motion.  Neurological: She is alert and oriented to person, place, and time. She has normal reflexes.  Skin: Skin is warm and dry.  Psychiatric: She has a normal mood and affect. Her behavior is normal. Judgment and thought content normal.    Prenatal labs: ABO, Rh: O/POS/-- (02/25 1136) Antibody: NEG (02/25 1136) Rubella: 13.80 (02/25 1136) RPR: NON REAC (02/25 1136)  HBsAg: NEGATIVE (02/25 1136)  HIV: NON REACTIVE (02/25 1136)  GBS: Positive (06/03 0000)  1hr glucola: 94  Assessment/Plan: A:  [redacted]w[redacted]d SIUP  G1P0000   Cat I FHR  GBS pos  IOL d/t postdates  P:  Admit to BS  IV pain meds prn/epidural prn active labor  Pitocin per protocol once foley bulb comes out  PCN for gbs w/ pit/rom/active labor, whichever first  Anticipate NSVD  Plans to breastfeed, depo for contraception, and OP circumcision   Marge Duncans 08/31/2012, 9:22 PM

## 2012-09-01 ENCOUNTER — Inpatient Hospital Stay (HOSPITAL_COMMUNITY): Payer: Medicaid Other | Admitting: Anesthesiology

## 2012-09-01 ENCOUNTER — Encounter (HOSPITAL_COMMUNITY): Payer: Self-pay

## 2012-09-01 ENCOUNTER — Encounter (HOSPITAL_COMMUNITY): Payer: Self-pay | Admitting: Anesthesiology

## 2012-09-01 ENCOUNTER — Encounter (HOSPITAL_COMMUNITY)
Admission: RE | Disposition: A | Discharge status: Home / Self Care | Payer: Self-pay | Source: Ambulatory Visit | Attending: Obstetrics & Gynecology

## 2012-09-01 LAB — RPR: RPR Ser Ql: NONREACTIVE

## 2012-09-01 SURGERY — Surgical Case
Anesthesia: Epidural | Site: Abdomen | Wound class: Clean Contaminated

## 2012-09-01 MED ORDER — MEPERIDINE HCL 25 MG/ML IJ SOLN
INTRAMUSCULAR | Status: AC
  Filled 2012-09-01: qty 1

## 2012-09-01 MED ORDER — TERBUTALINE SULFATE 1 MG/ML IJ SOLN: 0.2500 mg | Freq: Once | INTRAMUSCULAR | Status: DC | PRN

## 2012-09-01 MED ORDER — PENICILLIN G POTASSIUM 5000000 UNITS IJ SOLR
5.0000 10*6.[IU] | Freq: Once | INTRAVENOUS | Status: AC
  Administered 2012-09-01: 5 10*6.[IU] via INTRAVENOUS
  Filled 2012-09-01: qty 5

## 2012-09-01 MED ORDER — KETOROLAC TROMETHAMINE 60 MG/2ML IM SOLN
INTRAMUSCULAR | Status: AC
  Filled 2012-09-01: qty 2

## 2012-09-01 MED ORDER — ONDANSETRON HCL 4 MG/2ML IJ SOLN: 4.0000 mg | Freq: Three times a day (TID) | INTRAMUSCULAR | Status: DC | PRN

## 2012-09-01 MED ORDER — KETOROLAC TROMETHAMINE 30 MG/ML IJ SOLN: 30.0000 mg | Freq: Four times a day (QID) | INTRAMUSCULAR | Status: DC | PRN

## 2012-09-01 MED ORDER — MORPHINE SULFATE (PF) 0.5 MG/ML IJ SOLN
INTRAMUSCULAR | Status: DC | PRN
  Administered 2012-09-01: 1 mg via EPIDURAL

## 2012-09-01 MED ORDER — LACTATED RINGERS IV SOLN
INTRAVENOUS | Status: DC | PRN
  Administered 2012-09-01 (×2): via INTRAVENOUS

## 2012-09-01 MED ORDER — DIPHENHYDRAMINE HCL 50 MG/ML IJ SOLN: 12.5000 mg | INTRAMUSCULAR | Status: DC | PRN

## 2012-09-01 MED ORDER — MEASLES, MUMPS & RUBELLA VAC ~~LOC~~ INJ
0.5000 mL | INJECTION | Freq: Once | SUBCUTANEOUS | Status: DC
  Filled 2012-09-01: qty 0.5

## 2012-09-01 MED ORDER — DIPHENHYDRAMINE HCL 50 MG/ML IJ SOLN: 25.0000 mg | INTRAMUSCULAR | Status: DC | PRN

## 2012-09-01 MED ORDER — LACTATED RINGERS IV SOLN
INTRAVENOUS | Status: DC | PRN
  Administered 2012-09-01: 18:00:00 via INTRAVENOUS

## 2012-09-01 MED ORDER — GENTAMICIN SULFATE 40 MG/ML IJ SOLN: 120.0000 mg | Freq: Three times a day (TID) | INTRAVENOUS | Status: DC

## 2012-09-01 MED ORDER — SODIUM BICARBONATE 8.4 % IV SOLN
INTRAVENOUS | Status: DC | PRN
  Administered 2012-09-01: 5 mL via EPIDURAL

## 2012-09-01 MED ORDER — LANOLIN HYDROUS EX OINT: 1.0000 "application " | TOPICAL_OINTMENT | CUTANEOUS | Status: DC | PRN

## 2012-09-01 MED ORDER — MEPERIDINE HCL 25 MG/ML IJ SOLN
6.2500 mg | INTRAMUSCULAR | Status: DC | PRN
  Administered 2012-09-01: 12.5 mg via INTRAVENOUS

## 2012-09-01 MED ORDER — MENTHOL 3 MG MT LOZG: 1.0000 | LOZENGE | OROMUCOSAL | Status: DC | PRN

## 2012-09-01 MED ORDER — PROMETHAZINE HCL 25 MG/ML IJ SOLN: 6.2500 mg | INTRAMUSCULAR | Status: DC | PRN

## 2012-09-01 MED ORDER — SIMETHICONE 80 MG PO CHEW
80.0000 mg | CHEWABLE_TABLET | ORAL | Status: DC | PRN
  Administered 2012-09-02 – 2012-09-03 (×6): 80 mg via ORAL

## 2012-09-01 MED ORDER — PHENYLEPHRINE 40 MCG/ML (10ML) SYRINGE FOR IV PUSH (FOR BLOOD PRESSURE SUPPORT)
80.0000 ug | PREFILLED_SYRINGE | INTRAVENOUS | Status: AC | PRN
  Administered 2012-09-01 (×2): 80 ug via INTRAVENOUS
  Administered 2012-09-01: 40 ug via INTRAVENOUS
  Filled 2012-09-01: qty 5

## 2012-09-01 MED ORDER — OXYCODONE-ACETAMINOPHEN 5-325 MG PO TABS
1.0000 | ORAL_TABLET | ORAL | Status: DC | PRN
  Administered 2012-09-02: 1 via ORAL
  Administered 2012-09-02 (×2): 2 via ORAL
  Administered 2012-09-02: 1 via ORAL
  Administered 2012-09-02 – 2012-09-03 (×4): 2 via ORAL
  Administered 2012-09-04: 1 via ORAL
  Filled 2012-09-01: qty 1
  Filled 2012-09-01: qty 2
  Filled 2012-09-01: qty 1
  Filled 2012-09-01 (×2): qty 2
  Filled 2012-09-01: qty 1
  Filled 2012-09-01 (×3): qty 2

## 2012-09-01 MED ORDER — OXYTOCIN 40 UNITS IN LACTATED RINGERS INFUSION - SIMPLE MED
1.0000 m[IU]/min | INTRAVENOUS | Status: DC
  Administered 2012-09-01: 8 m[IU]/min via INTRAVENOUS
  Administered 2012-09-01: 2 m[IU]/min via INTRAVENOUS
  Administered 2012-09-01: 4 m[IU]/min via INTRAVENOUS

## 2012-09-01 MED ORDER — DIPHENHYDRAMINE HCL 25 MG PO CAPS: 25.0000 mg | ORAL_CAPSULE | Freq: Four times a day (QID) | ORAL | Status: DC | PRN

## 2012-09-01 MED ORDER — HYDROMORPHONE HCL PF 1 MG/ML IJ SOLN
0.2500 mg | INTRAMUSCULAR | Status: DC | PRN
  Administered 2012-09-01: 0.05 mg via INTRAVENOUS

## 2012-09-01 MED ORDER — WITCH HAZEL-GLYCERIN EX PADS: 1.0000 "application " | MEDICATED_PAD | CUTANEOUS | Status: DC | PRN

## 2012-09-01 MED ORDER — LACTATED RINGERS IV SOLN
INTRAVENOUS | Status: DC
  Administered 2012-09-02: 05:00:00 via INTRAVENOUS

## 2012-09-01 MED ORDER — TETANUS-DIPHTH-ACELL PERTUSSIS 5-2.5-18.5 LF-MCG/0.5 IM SUSP: 0.5000 mL | Freq: Once | INTRAMUSCULAR | Status: DC

## 2012-09-01 MED ORDER — SODIUM CHLORIDE 0.9 % IJ SOLN: 3.0000 mL | INTRAMUSCULAR | Status: DC | PRN

## 2012-09-01 MED ORDER — SCOPOLAMINE 1 MG/3DAYS TD PT72
MEDICATED_PATCH | TRANSDERMAL | Status: AC
  Filled 2012-09-01: qty 1

## 2012-09-01 MED ORDER — SODIUM CHLORIDE 0.9 % IV SOLN
2.0000 g | Freq: Four times a day (QID) | INTRAVENOUS | Status: DC
Start: 2012-09-01 — End: 2012-09-01
  Filled 2012-09-01 (×2): qty 2000

## 2012-09-01 MED ORDER — GENTAMICIN SULFATE 40 MG/ML IJ SOLN
120.0000 mg | Freq: Three times a day (TID) | INTRAVENOUS | Status: DC
  Filled 2012-09-01: qty 3

## 2012-09-01 MED ORDER — MORPHINE SULFATE 0.5 MG/ML IJ SOLN
INTRAMUSCULAR | Status: AC
  Filled 2012-09-01: qty 10

## 2012-09-01 MED ORDER — OXYTOCIN 40 UNITS IN LACTATED RINGERS INFUSION - SIMPLE MED: 62.5000 mL/h | INTRAVENOUS | Status: AC

## 2012-09-01 MED ORDER — NALBUPHINE HCL 10 MG/ML IJ SOLN: 5.0000 mg | INTRAMUSCULAR | Status: DC | PRN

## 2012-09-01 MED ORDER — PHENYLEPHRINE 40 MCG/ML (10ML) SYRINGE FOR IV PUSH (FOR BLOOD PRESSURE SUPPORT): 80.0000 ug | PREFILLED_SYRINGE | INTRAVENOUS | Status: DC | PRN

## 2012-09-01 MED ORDER — KETOROLAC TROMETHAMINE 60 MG/2ML IM SOLN
60.0000 mg | Freq: Once | INTRAMUSCULAR | Status: AC | PRN
  Administered 2012-09-01: 60 mg via INTRAMUSCULAR

## 2012-09-01 MED ORDER — HYDROMORPHONE HCL PF 1 MG/ML IJ SOLN
INTRAMUSCULAR | Status: AC
  Filled 2012-09-01: qty 1

## 2012-09-01 MED ORDER — SCOPOLAMINE 1 MG/3DAYS TD PT72
1.0000 | MEDICATED_PATCH | Freq: Once | TRANSDERMAL | Status: DC
  Administered 2012-09-01: 1.5 mg via TRANSDERMAL

## 2012-09-01 MED ORDER — NALOXONE HCL 0.4 MG/ML IJ SOLN: 0.4000 mg | INTRAMUSCULAR | Status: DC | PRN

## 2012-09-01 MED ORDER — DIPHENHYDRAMINE HCL 25 MG PO CAPS: 25.0000 mg | ORAL_CAPSULE | ORAL | Status: DC | PRN

## 2012-09-01 MED ORDER — CLINDAMYCIN PHOSPHATE 900 MG/50ML IV SOLN
900.0000 mg | Freq: Three times a day (TID) | INTRAVENOUS | Status: DC
  Filled 2012-09-01: qty 50

## 2012-09-01 MED ORDER — MORPHINE SULFATE (PF) 0.5 MG/ML IJ SOLN
INTRAMUSCULAR | Status: DC | PRN
  Administered 2012-09-01: 4 mg via EPIDURAL

## 2012-09-01 MED ORDER — FENTANYL 2.5 MCG/ML BUPIVACAINE 1/10 % EPIDURAL INFUSION (WH - ANES)
INTRAMUSCULAR | Status: DC | PRN
  Administered 2012-09-01: 14 mL/h via EPIDURAL

## 2012-09-01 MED ORDER — CEFAZOLIN SODIUM-DEXTROSE 2-3 GM-% IV SOLR
INTRAVENOUS | Status: DC | PRN
  Administered 2012-09-01: 2 g via INTRAVENOUS

## 2012-09-01 MED ORDER — METOCLOPRAMIDE HCL 5 MG/ML IJ SOLN: 10.0000 mg | Freq: Three times a day (TID) | INTRAMUSCULAR | Status: DC | PRN

## 2012-09-01 MED ORDER — NALBUPHINE HCL 10 MG/ML IJ SOLN
5.0000 mg | INTRAMUSCULAR | Status: DC | PRN
  Filled 2012-09-01: qty 1

## 2012-09-01 MED ORDER — 0.9 % SODIUM CHLORIDE (POUR BTL) OPTIME
TOPICAL | Status: DC | PRN
  Administered 2012-09-01: 1000 mL

## 2012-09-01 MED ORDER — FENTANYL 2.5 MCG/ML BUPIVACAINE 1/10 % EPIDURAL INFUSION (WH - ANES)
14.0000 mL/h | INTRAMUSCULAR | Status: DC | PRN
  Filled 2012-09-01: qty 125

## 2012-09-01 MED ORDER — OXYTOCIN 10 UNIT/ML IJ SOLN
40.0000 [IU] | INTRAVENOUS | Status: DC | PRN
  Administered 2012-09-01: 40 [IU] via INTRAVENOUS

## 2012-09-01 MED ORDER — LIDOCAINE HCL (PF) 1 % IJ SOLN
INTRAMUSCULAR | Status: DC | PRN
  Administered 2012-09-01 (×2): 3 mL

## 2012-09-01 MED ORDER — EPHEDRINE 5 MG/ML INJ: 10.0000 mg | INTRAVENOUS | Status: DC | PRN

## 2012-09-01 MED ORDER — EPHEDRINE 5 MG/ML INJ
10.0000 mg | INTRAVENOUS | Status: DC | PRN
  Filled 2012-09-01: qty 4

## 2012-09-01 MED ORDER — LACTATED RINGERS IV SOLN
500.0000 mL | Freq: Once | INTRAVENOUS | Status: AC
  Administered 2012-09-01: 09:00:00 via INTRAVENOUS

## 2012-09-01 MED ORDER — NALOXONE HCL 1 MG/ML IJ SOLN: 1.0000 ug/kg/h | INTRAVENOUS | Status: DC | PRN

## 2012-09-01 MED ORDER — DIBUCAINE 1 % RE OINT: 1.0000 "application " | TOPICAL_OINTMENT | RECTAL | Status: DC | PRN

## 2012-09-01 MED ORDER — GENTAMICIN SULFATE 40 MG/ML IJ SOLN
160.0000 mg | Freq: Once | INTRAVENOUS | Status: AC
  Administered 2012-09-01: 160 mg via INTRAVENOUS
  Filled 2012-09-01: qty 4

## 2012-09-01 MED ORDER — ONDANSETRON HCL 4 MG/2ML IJ SOLN
INTRAMUSCULAR | Status: DC | PRN
  Administered 2012-09-01: 4 mg via INTRAVENOUS

## 2012-09-01 MED ORDER — ONDANSETRON HCL 4 MG/2ML IJ SOLN: 4.0000 mg | INTRAMUSCULAR | Status: DC | PRN

## 2012-09-01 MED ORDER — MEPERIDINE HCL 25 MG/ML IJ SOLN: 6.2500 mg | INTRAMUSCULAR | Status: DC | PRN

## 2012-09-01 MED ORDER — GENTAMICIN SULFATE 40 MG/ML IJ SOLN
Freq: Three times a day (TID) | INTRAVENOUS | Status: DC
  Administered 2012-09-01 – 2012-09-02 (×2): via INTRAVENOUS
  Filled 2012-09-01 (×6): qty 3

## 2012-09-01 MED ORDER — IBUPROFEN 600 MG PO TABS
600.0000 mg | ORAL_TABLET | Freq: Four times a day (QID) | ORAL | Status: DC
  Administered 2012-09-02 – 2012-09-04 (×10): 600 mg via ORAL
  Filled 2012-09-01 (×10): qty 1

## 2012-09-01 MED ORDER — CEFAZOLIN SODIUM-DEXTROSE 2-3 GM-% IV SOLR
INTRAVENOUS | Status: AC
  Filled 2012-09-01: qty 50

## 2012-09-01 MED ORDER — ONDANSETRON HCL 4 MG PO TABS: 4.0000 mg | ORAL_TABLET | ORAL | Status: DC | PRN

## 2012-09-01 MED ORDER — PRENATAL MULTIVITAMIN CH
1.0000 | ORAL_TABLET | Freq: Every day | ORAL | Status: DC
  Administered 2012-09-02 – 2012-09-04 (×3): 1 via ORAL
  Filled 2012-09-01 (×3): qty 1

## 2012-09-01 MED ORDER — ACETAMINOPHEN 10 MG/ML IV SOLN: 1000.0000 mg | Freq: Once | INTRAVENOUS | Status: DC | PRN

## 2012-09-01 SURGICAL SUPPLY — 30 items
BENZOIN TINCTURE PRP APPL 2/3 (GAUZE/BANDAGES/DRESSINGS) ×2 IMPLANT
CLAMP CORD UMBIL (MISCELLANEOUS) IMPLANT
CLOTH BEACON ORANGE TIMEOUT ST (SAFETY) ×2 IMPLANT
CONTAINER PREFILL 10% NBF 15ML (MISCELLANEOUS) IMPLANT
DRAIN JACKSON PRT FLT 7MM (DRAIN) IMPLANT
DRAPE LG THREE QUARTER DISP (DRAPES) ×2 IMPLANT
DRSG OPSITE POSTOP 4X10 (GAUZE/BANDAGES/DRESSINGS) ×2 IMPLANT
DURAPREP 26ML APPLICATOR (WOUND CARE) ×2 IMPLANT
ELECT REM PT RETURN 9FT ADLT (ELECTROSURGICAL) ×2
ELECTRODE REM PT RTRN 9FT ADLT (ELECTROSURGICAL) ×1 IMPLANT
EVACUATOR SILICONE 100CC (DRAIN) IMPLANT
EXTRACTOR VACUUM M CUP 4 TUBE (SUCTIONS) IMPLANT
GLOVE BIO SURGEON STRL SZ7 (GLOVE) ×2 IMPLANT
GLOVE BIOGEL PI IND STRL 7.0 (GLOVE) ×1 IMPLANT
GLOVE BIOGEL PI INDICATOR 7.0 (GLOVE) ×1
GOWN STRL REIN XL XLG (GOWN DISPOSABLE) ×4 IMPLANT
KIT ABG SYR 3ML LUER SLIP (SYRINGE) IMPLANT
NEEDLE HYPO 25X5/8 SAFETYGLIDE (NEEDLE) ×2 IMPLANT
NS IRRIG 1000ML POUR BTL (IV SOLUTION) ×2 IMPLANT
PACK C SECTION WH (CUSTOM PROCEDURE TRAY) ×2 IMPLANT
PAD OB MATERNITY 4.3X12.25 (PERSONAL CARE ITEMS) ×2 IMPLANT
RTRCTR C-SECT PINK 25CM LRG (MISCELLANEOUS) ×2 IMPLANT
STAPLER VISISTAT 35W (STAPLE) IMPLANT
STRIP CLOSURE SKIN 1/2X4 (GAUZE/BANDAGES/DRESSINGS) ×2 IMPLANT
SUT VIC AB 0 CTX 36 (SUTURE) ×4
SUT VIC AB 0 CTX36XBRD ANBCTRL (SUTURE) ×4 IMPLANT
SUT VIC AB 4-0 KS 27 (SUTURE) ×2 IMPLANT
TOWEL OR 17X24 6PK STRL BLUE (TOWEL DISPOSABLE) ×6 IMPLANT
TRAY FOLEY CATH 14FR (SET/KITS/TRAYS/PACK) IMPLANT
WATER STERILE IRR 1000ML POUR (IV SOLUTION) ×2 IMPLANT

## 2012-09-01 NOTE — Progress Notes (Signed)
Valerie Reyes is a 27 y.o. G1P0000 at [redacted]w[redacted]d by ultrasound admitted for induction of labor due to Post dates. Due date 08/24/12.  Subjective:   Objective: BP 97/73  Pulse 95  Temp(Src) 98.2 F (36.8 C) (Oral)  Resp 18  Ht 5' 1.5" (1.562 m)  Wt 150 lb (68.04 kg)  BMI 27.89 kg/m2  SpO2 100%  LMP 10/22/2011      FHT:  FHR: 130's bpm, variability: moderate,  accelerations:  Present,  decelerations:  Absent UC:   regular, every 3-5  minutes SVE:   Dilation: 6 Effacement (%): 80 Station: -3 Exam by:: kbooker, cnm  Labs: Lab Results  Component Value Date   WBC 8.8 08/31/2012   HGB 14.2 08/31/2012   HCT 42.0 08/31/2012   MCV 90.3 08/31/2012   PLT 177 08/31/2012    Assessment / Plan: Induction of labor due to postterm,  progressing well on pitocin  Labor: Progressing normally Preeclampsia:  no signs or symptoms of toxicity Fetal Wellbeing:  Category I Pain Control:  Epidural I/D:  n/a Anticipated MOD:  NSVD  [name] DARLENE 09/01/2012, 10:01 AM

## 2012-09-01 NOTE — Progress Notes (Signed)
Patient ID: Valerie Reyes, female   DOB: September 05, 1985, 27 y.o.   MRN: [id number] SVE no change in cervix. FHR pattern stable. IUPC placed to assess contraction strength.

## 2012-09-01 NOTE — Anesthesia Preprocedure Evaluation (Addendum)
Anesthesia Evaluation  Patient identified by MRN, date of birth, ID band Patient awake    Reviewed: Allergy & Precautions, H&P , NPO status , Patient's Chart, lab work & pertinent test results  Airway Mallampati: II TM Distance: >3 FB Neck ROM: Full    Dental   Pulmonary neg pulmonary ROS,          Cardiovascular negative cardio ROS  Rhythm:Regular     Neuro/Psych negative neurological ROS  negative psych ROS   GI/Hepatic negative GI ROS, Neg liver ROS,   Endo/Other  negative endocrine ROS  Renal/GU negative Renal ROS     Musculoskeletal negative musculoskeletal ROS (+)   Abdominal   Peds  Hematology negative hematology ROS (+)   Anesthesia Other Findings   Reproductive/Obstetrics (+) Pregnancy                           Anesthesia Physical Anesthesia Plan  ASA: II  Anesthesia Plan: Epidural   Post-op Pain Management:    Induction:   Airway Management Planned:   Additional Equipment:   Intra-op Plan:   Post-operative Plan:   Informed Consent: I have reviewed the patients History and Physical, chart, labs and discussed the procedure including the risks, benefits and alternatives for the proposed anesthesia with the patient or authorized representative who has indicated his/her understanding and acceptance.   Dental advisory given  Plan Discussed with: CRNA  Anesthesia Plan Comments:        Anesthesia Quick Evaluation

## 2012-09-01 NOTE — Transfer of Care (Signed)
Immediate Anesthesia Transfer of Care Note  Patient: Valerie Reyes  Procedure(s) Performed: Procedure(s): CESAREAN SECTION (N/A)  Patient Location: PACU  Anesthesia Type:Epidural  Level of Consciousness: awake, alert  and oriented  Airway & Oxygen Therapy: Patient Spontanous Breathing  Post-op Assessment: Report given to PACU RN and Post -op Vital signs reviewed and stable  Post vital signs: Reviewed and stable  Complications: No apparent anesthesia complications

## 2012-09-01 NOTE — Progress Notes (Signed)
Foley bulb falls out.

## 2012-09-01 NOTE — Progress Notes (Signed)
Patient ID: Valerie Reyes, female   DOB: Feb 18, 1986, 27 y.o.   MRN: [id number]  Pt has been on pitocin twice with discontinuation due to non reassuring fetal heart tracing.  Pt has ben adequate when on pitocin with no cervical dilation.  The cervix has thickened markedly.  Pt ahs also developed a fever and presumed chorioamnionitis.  Given the lack of cervical change and the intolerance to labor, will proceed with primary c/s.   The risks of cesarean section discussed with the patient included but were not limited to: bleeding which may require transfusion or reoperation; infection which may require antibiotics; injury to bowel, bladder, ureters or other surrounding organs; injury to the fetus; need for additional procedures including hysterectomy in the event of a life-threatening hemorrhage; placental abnormalities wth subsequent pregnancies, incisional problems, thromboembolic phenomenon and other postoperative/anesthesia complications. The patient concurred with the proposed plan, giving informed written consent for the procedure.

## 2012-09-01 NOTE — Progress Notes (Signed)
ANTIBIOTIC CONSULT NOTE - INITIAL  Pharmacy Consult for Gentamicin Indication: Chorioamnionitis   No Known Allergies  Patient Measurements: Height: 5' 1.5" (156.2 cm) Weight: 150 lb (68.04 kg) IBW/kg (Calculated) : 48.95kg Adjusted Body Weight: 55 kg  Vital Signs: Temp: 99.9 F (37.7 C) (07/06 1521) Temp src: Oral (07/06 1521) BP: 110/70 mmHg (07/06 1501) Pulse Rate: 93 (07/06 1501)  Labs:  Recent Labs  08/31/12 1930  WBC 8.8  HGB 14.2  PLT 177     Medications:  Penicillin for GBS prophylaxis changed to ampicillin & gentamicin  Assessment: 27 y.o. female G1P0000 at [redacted]w[redacted]d Estimated Ke = 0.331, Vd = 0.35 L/kg  Goal of Therapy:  Gentamicin peak 6-8 mg/L and Trough < 1 mg/L  Plan:  Gentamicin 160 mg IV x 1  Gentamicin 120 mg IV every 8 hrs  Check Scr with next labs if gentamicin continued. Will check gentamicin levels if continued > 72hr or clinically indicated.  Natasha Bence 09/01/2012,3:42 PM

## 2012-09-01 NOTE — Progress Notes (Signed)
Valerie Reyes is a 27 y.o. G1P0000 at [redacted]w[redacted]d admitted for induction of labor due to postdates.  Subjective: Uncomfortable w/ uc's, requesting more pain medicine  Objective: BP 115/75  Pulse 102  Temp(Src) 98.4 F (36.9 C) (Oral)  Resp 18  Ht 5' 1.5" (1.562 m)  Wt 68.04 kg (150 lb)  BMI 27.89 kg/m2  LMP 10/22/2011      FHT:  FHR: 135 bpm, variability: moderate,  accelerations:  Present,  decelerations:  Absent UC:   regular, every 4-7 minutes SVE:   Dilation: 6 Effacement (%): 80 Station: -3 Exam by:: kbooker, cnm Foley bulb sitting in vagina, removed  Labs: Lab Results  Component Value Date   WBC 8.8 08/31/2012   HGB 14.2 08/31/2012   HCT 42.0 08/31/2012   MCV 90.3 08/31/2012   PLT 177 08/31/2012    Assessment / Plan: IOL d/t postdates, cervical foley bulb now out, will begin pitocin per protocol to achieve adequate labor pattern/dilation. AROM when vtx descends further  Labor: Progressing normally Preeclampsia:  n/a Fetal Wellbeing:  Category I Pain Control:  Fentanyl I/D:  begin pcn for gbs pos Anticipated MOD:  NSVD  Marge Duncans 09/01/2012, 6:31 AM

## 2012-09-01 NOTE — Anesthesia Procedure Notes (Signed)
Epidural Patient location during procedure: OB Start time: 09/01/2012 9:20 AM End time: 09/01/2012 9:30 AM  Staffing Anesthesiologist: Lewie Loron R Performed by: anesthesiologist   Preanesthetic Checklist Completed: patient identified, pre-op evaluation, timeout performed, IV checked, risks and benefits discussed and monitors and equipment checked  Epidural Patient position: sitting Prep: site prepped and draped and DuraPrep Patient monitoring: heart rate, continuous pulse ox and blood pressure Approach: midline Injection technique: LOR air  Needle:  Needle type: Tuohy  Needle gauge: 17 G Needle length: 9 cm Needle insertion depth: 5 cm Catheter type: closed end flexible Catheter size: 19 Gauge Catheter at skin depth: 11 cm Test dose: negative  Assessment Sensory level: T8 Events: blood not aspirated, injection not painful, no injection resistance, negative IV test and no paresthesia  Additional Notes Reason for block:procedure for pain

## 2012-09-01 NOTE — Anesthesia Postprocedure Evaluation (Signed)
Anesthesia Post Note  Patient: Valerie Reyes  Procedure(s) Performed: Procedure(s) (LRB): CESAREAN SECTION (N/A)  Anesthesia type: Epidural  Patient location: PACU  Post pain: Pain level controlled  Post assessment: Post-op Vital signs reviewed  Last Vitals: BP 107/68  Pulse 92  Temp(Src) 37.1 C (Oral)  Resp 18  Ht 5' 1.5" (1.562 m)  Wt 150 lb (68.04 kg)  BMI 27.89 kg/m2  SpO2 100%  LMP 10/22/2011  Post vital signs: Reviewed  Level of consciousness: sedated  Complications: No apparent anesthesia complications

## 2012-09-02 ENCOUNTER — Encounter (HOSPITAL_COMMUNITY): Payer: Self-pay | Admitting: Obstetrics & Gynecology

## 2012-09-02 DIAGNOSIS — O480 Post-term pregnancy: Secondary | ICD-10-CM

## 2012-09-02 DIAGNOSIS — O9989 Other specified diseases and conditions complicating pregnancy, childbirth and the puerperium: Secondary | ICD-10-CM

## 2012-09-02 DIAGNOSIS — O411090 Infection of amniotic sac and membranes, unspecified, unspecified trimester, not applicable or unspecified: Secondary | ICD-10-CM

## 2012-09-02 LAB — CBC
Hemoglobin: 11.3 g/dL — ABNORMAL LOW (ref 12.0–15.0)
MCHC: 34.7 g/dL (ref 30.0–36.0)
Platelets: 122 10*3/uL — ABNORMAL LOW (ref 150–400)
RDW: 13.9 % (ref 11.5–15.5)

## 2012-09-02 NOTE — Progress Notes (Signed)
Subjective: Postpartum Day 1: Cesarean Delivery Patient reports incisional pain and tolerating PO.  Foley will be removed this morning.   Objective: Vital signs in last 24 hours: Temp:  [97.8 F (36.6 C)-100.9 F (38.3 C)] 97.9 F (36.6 C) (07/07 0630) Pulse Rate:  [73-117] 73 (07/07 0630) Resp:  [14-22] 18 (07/07 0630) BP: (87-122)/(49-79) 89/57 mmHg (07/07 0630) SpO2:  [76 %-100 %] 97 % (07/07 0630)  Physical Exam:  General: alert, cooperative and no distress Lochia: appropriate Uterine Fundus: firm Incision: healing well, had drainage onto honeycomb dressing, reinforced with another dressing, which is dry. DVT Evaluation: No evidence of DVT seen on physical exam.   Recent Labs  08/31/12 1930 09/02/12 0551  HGB 14.2 11.3*  HCT 42.0 32.6*    Assessment/Plan: Status post Cesarean section. Doing well postoperatively.  Continue current care.  Valerie Reyes 09/02/2012, 9:00 AM

## 2012-09-02 NOTE — Op Note (Signed)
Valerie Reyes PROCEDURE DATE: 08/31/2012 - 09/01/2012  PREOPERATIVE DIAGNOSIS: Intrauterine pregnancy at  [redacted]w[redacted]d weeks gestation with failure to dilate, intolerance to labor, and chorioamnionitis  POSTOPERATIVE DIAGNOSIS: The same  PROCEDURE:    Low Transverse Cesarean Section  SURGEON:  Dr. Elsie Lincoln  ASSISTANT: Zerita Boers, CNM  INDICATIONS: Valerie Reyes is a 27 y.o. G1P1001 at [redacted]w[redacted]d with failure to dilate, intolerance to labor, and chorioamnionitis  .  The risks of cesarean section discussed with the patient included but were not limited to: bleeding which may require transfusion or reoperation; infection which may require antibiotics; injury to bowel, bladder, ureters or other surrounding organs; injury to the fetus; need for additional procedures including hysterectomy in the event of a life-threatening hemorrhage; placental abnormalities wth subsequent pregnancies, incisional problems, thromboembolic phenomenon and other postoperative/anesthesia complications. The patient concurred with the proposed plan, giving informed written consent for the procedure.    FINDINGS:  Viable infant in cephalic presentation, clear amniotic fluid.  Intact placenta, three vessel cord.  Grossly normal uterus, ovaries and fallopian tubes. .   ANESTHESIA:    Epidural  ESTIMATED BLOOD LOSS: 1000 cc  SPECIMENS: Placenta sent to pathology  COMPLICATIONS: None immediate  PROCEDURE IN DETAIL:  The patient received intravenous antibiotics and had sequential compression devices applied to her lower extremities.  She was then taken to the operating room where epidural anesthesia was dosed up to surgical level and was found to be adequate. She was then placed in a dorsal supine position with a leftward tilt, and prepped and draped in a sterile manner.  A foley catheter was already in her bladder and attached to constant gravity.  After an adequate timeout was performed, a Pfannenstiel skin incision was  made with scalpel and carried through to the underlying layer of fascia. The fascia was incised in the midline and this incision was extended bilaterally using the Mayo scissors. Kocher clamps were applied to the superior aspect of the fascial incision and the underlying rectus muscles were dissected off bluntly. A similar process was carried out on the inferior aspect of the facial incision. The rectus muscles were separated in the midline bluntly and the peritoneum was entered bluntly.   A transverse hysterotomy was made with a scalpel and extended bilaterally bluntly. The bladder blade was then removed. The infant was successfully delivered, and cord was clamped and cut and infant was handed over to awaiting neonatology team. Uterine massage was then administered and the placenta delivered intact with three-vessel cord. The uterus was cleared of clot and debris.  The hysterotomy was closed with 0 vicryl.  A second imbricating suture of 0-Vicryl was used to reinforce the incision and aid in hemostasis.  The peritoneum and rectus muscles were noted to be hemostatic.  The fascia was closed with 0-Vicryl in a running fashion with good restoration of anatomy.  The subcutaneus tissue was copiously irrigated.  The skin was closed with 4-0 Vicryl in a subcuticular fashion.  Pt tolerated the procedure will.  All counts were correct x2.  Pt went to the recovery room in stable condition.

## 2012-09-02 NOTE — Progress Notes (Signed)
UR chart review completed.  

## 2012-09-02 NOTE — Anesthesia Postprocedure Evaluation (Signed)
  Anesthesia Post-op Note  Patient: Valerie Reyes  Procedure(s) Performed: Procedure(s): CESAREAN SECTION (N/A)  Patient Location: Mother/Baby  Anesthesia Type:Epidural  Level of Consciousness: awake  Airway and Oxygen Therapy: Patient Spontanous Breathing  Post-op Pain: none  Post-op Assessment: Patient's Cardiovascular Status Stable, Respiratory Function Stable, Patent Airway, No signs of Nausea or vomiting, Adequate PO intake, Pain level controlled, No headache, No backache, No residual numbness and No residual motor weakness  Post-op Vital Signs: Reviewed and stable  Complications: No apparent anesthesia complications

## 2012-09-03 ENCOUNTER — Encounter (HOSPITAL_COMMUNITY): Payer: Self-pay

## 2012-09-03 NOTE — Progress Notes (Signed)
Post Partum Day 2: Cesarean Delivery Subjective: up ad lib, voiding, tolerating PO, + flatus and doing well with breastfeeding.  Objective: Blood pressure 107/74, pulse 65, temperature 97.8 F (36.6 C), temperature source Oral, resp. rate 18, height 5' 1.5" (1.562 m), weight 68.04 kg (150 lb), last menstrual period 10/22/2011, SpO2 97.00%, unknown if currently breastfeeding.  Physical Exam:  General: alert, cooperative, appears stated age and no distress Lochia: appropriate Uterine Fundus: firm Incision: healing well, no significant drainage seen under dry dressing DVT Evaluation: No evidence of DVT seen on physical exam. No cords or calf tenderness. No significant calf/ankle edema. NABS   Recent Labs  08/31/12 1930 09/02/12 0551  HGB 14.2 11.3*  HCT 42.0 32.6*    Assessment/Plan: Status post c-section, doing well postoperatively. Plan for discharge tomorrow, Breastfeeding and Contraception depo provera desired Continue current care. Circumcision - desired as outpatient at [facility]   LOS: 3 days   Simone Curia 09/03/2012, 7:38 AM  Evaluation and management procedures were performed by Resident physician under my supervision/collaboration. Chart reviewed, patient examined by me and I agree with management and plan.

## 2012-09-04 MED ORDER — POLYETHYLENE GLYCOL 3350 17 G PO PACK: 17.0000 g | PACK | Freq: Two times a day (BID) | ORAL | Status: DC

## 2012-09-04 MED ORDER — IBUPROFEN 600 MG PO TABS: 600.0000 mg | ORAL_TABLET | Freq: Four times a day (QID) | ORAL | Status: DC

## 2012-09-04 MED ORDER — OXYCODONE-ACETAMINOPHEN 5-325 MG PO TABS: 1.0000 | ORAL_TABLET | ORAL | Status: DC | PRN

## 2012-09-04 NOTE — Discharge Summary (Signed)
Obstetric Discharge Summary Reason for Admission: induction of labor Prenatal Procedures: Anatomy scan [place]: WNL Intrapartum Procedures: PCN for GBS positive, IUPC, gentamicin for suspected chorioamnionitis, cesarean: low cervical, transverse due to nonreassuring fetal heart tones Postpartum Procedures: none Complications-Operative and Postpartum: none Hemoglobin  Date Value Range Status  09/02/2012 11.3* 12.0 - 15.0 g/dL Final     DELTA CHECK NOTED     REPEATED TO VERIFY     HCT  Date Value Range Status  09/02/2012 32.6* 36.0 - 46.0 % Final    Physical Exam:  General: alert, cooperative, appears stated age and no distress Lochia: appropriate Uterine Fundus: firm Incision: healing well, no dehiscence, no significant erythema, bloody drainage onto honeycomb dressing marked yesterday, not significantly beyond this mark today (1cm beyond border) DVT Evaluation: No evidence of DVT seen on physical exam. No cords or calf tenderness. No significant calf/ankle edema. NABS RRR by bilateral 2+ radial pulses Normal respiratory effort  Discharge Diagnoses: Term Pregnancy-delivered by LTCS due to Failed induction/non-reassuring fetal heart tones  Discharge Information: Date: 09/04/2012 Activity: pelvic rest Diet: routine Medications: Ibuprofen, Colace and Percocet Condition: stable Instructions: refer to practice specific booklet Discharge to: home Feeding preference: Breast Contraception: Plans depo Outpatient circumcision at [facility] planned  Newborn Data: Live born female  Birth Weight: 8 lb 3.2 oz (3719 g) APGAR: 9, 9  Home with mother.  Valerie Reyes 09/04/2012, 7:24 AM I have seen and examined this patient and agree the above assessment. CRESENZO-DISHMAN,Lashe Oliveira 09/04/2012 7:46 AM

## 2012-09-06 ENCOUNTER — Ambulatory Visit: Payer: Medicaid Other

## 2012-09-07 ENCOUNTER — Encounter (HOSPITAL_COMMUNITY): Payer: Self-pay | Admitting: *Deleted

## 2012-09-07 ENCOUNTER — Inpatient Hospital Stay (HOSPITAL_COMMUNITY)
Admission: AD | Admit: 2012-09-07 | Discharge: 2012-09-07 | Disposition: A | Discharge status: Home / Self Care | Payer: Medicaid Other | Source: Ambulatory Visit | Attending: Obstetrics & Gynecology | Admitting: Obstetrics & Gynecology

## 2012-09-07 DIAGNOSIS — O99893 Other specified diseases and conditions complicating puerperium: Secondary | ICD-10-CM | POA: Insufficient documentation

## 2012-09-07 DIAGNOSIS — K5900 Constipation, unspecified: Secondary | ICD-10-CM | POA: Insufficient documentation

## 2012-09-07 DIAGNOSIS — IMO0002 Reserved for concepts with insufficient information to code with codable children: Secondary | ICD-10-CM | POA: Insufficient documentation

## 2012-09-07 NOTE — MAU Provider Note (Signed)
Attestation of Attending Supervision of Advanced Practitioner (PA/CNM/NP): Evaluation and management procedures were performed by the Advanced Practitioner under my supervision and collaboration.  I have reviewed the Advanced Practitioner's note and chart, and I agree with the management and plan.  Vaneza Pickart, MD, FACOG Attending Obstetrician & Gynecologist Faculty Practice, [facility]  

## 2012-09-07 NOTE — MAU Provider Note (Signed)
History     CSN: [id number]  Arrival date and time: 09/07/12 0126   First Provider Initiated Contact with Patient 09/07/12 0226      Chief Complaint  Patient presents with  . Foot Swelling  . Leg Swelling   HPI Ms Kliebert is a 27yo G1P1 s/p C/S x 6d who presents for eval of swelling in ankles and constipation (no BM x 9d). Denies fever, N/V, H/A, anorexia. Reports nl lochia and nl incisional pain. Is pumping/breastfeeding.  OB History   Grav Para Term Preterm Abortions TAB SAB Ect Mult Living   1 1 1  0 0 0 0 0 0 1      Past Medical History  Diagnosis Date  . Pregnancy   . Bronchitis   . Medical history non-contributory     Past Surgical History  Procedure Laterality Date  . No past surgeries    . Cesarean section N/A 09/01/2012    Procedure: CESAREAN SECTION;  Surgeon: Lesly Dukes, MD;  Location: [facility];  Service: Obstetrics;  Laterality: N/A;    Family History  Problem Relation Age of Onset  . Cancer Mother   . Diabetes Mother   . Hypertension Mother   . Cancer Maternal Aunt     lung  . Diabetes Maternal Grandmother   . Stroke Maternal Grandmother   . Hypertension Maternal Grandmother   . Hypertension Paternal Grandmother   . Diabetes Paternal Grandmother     History  Substance Use Topics  . Smoking status: Former Smoker -- 1.00 packs/day    Types: Cigarettes    Quit date: 04/09/2012  . Smokeless tobacco: Not on file  . Alcohol Use: No    Allergies: No Known Allergies  Prescriptions prior to admission  Medication Sig Dispense Refill  . ibuprofen (ADVIL,MOTRIN) 600 MG tablet Take 1 tablet (600 mg total) by mouth every 6 (six) hours.  30 tablet  0  . oxyCODONE-acetaminophen (PERCOCET/ROXICET) 5-325 MG per tablet Take 1-2 tablets by mouth every 4 (four) hours as needed.  15 tablet  0  . polyethylene glycol (MIRALAX / GLYCOLAX) packet Take 17 g by mouth 2 (two) times daily. Until stooling regularly  30 packet  0  . Prenatal Vit-Fe Fumarate-FA  (PRENATAL COMPLETE) 14-0.4 MG TABS Take 1 tablet by mouth daily.  60 each  3    ROS Physical Exam   Blood pressure 127/80, pulse 59, temperature 97.3 F (36.3 C), resp. rate 20, height 5' 1.5" (1.562 m), weight 67.405 kg (148 lb 9.6 oz), last menstrual period 10/22/2011, unknown if currently breastfeeding.  Physical Exam  Constitutional: She is oriented to person, place, and time. She appears well-developed.  HENT:  Head: Normocephalic.  Neck: Normal range of motion.  Cardiovascular: Normal rate.   Respiratory: Effort normal.  GI: Soft.  Abd soft, appropriately tender only near incision, +bowel sounds x 4, sl distention; LTCS incision healing and well approx, SS intact  Musculoskeletal: Normal range of motion.  LE with 1+ edema, no pitting  Neurological: She is alert and oriented to person, place, and time.  Skin: Skin is warm and dry.  Psychiatric: She has a normal mood and affect. Her behavior is normal. Thought content normal.    MAU Course  Procedures  Offered to administer Fleets enema here in MAU vs pt doing it herself at home; pt and mother elect to use it at home  Assessment and Plan  S/p C/S x 6d Constipation Typical LE PP edema  D/C home with instructions  for Fleets enema to be purchased and followed according to package instructions- may repeat prn; continue with stool softener/Miralax. Reassured re swelling- instructed to continue drinking 80 oz water qd Pt plans to call clinic to schedule PP appt  Cam Hai 09/07/2012, 2:31 AM

## 2012-09-07 NOTE — Progress Notes (Signed)
Written and verbal d/c instructions given by Camelia Eng RN and understanding voiced.

## 2012-09-07 NOTE — MAU Note (Signed)
Pt had primary c/s 7/6 for failure to progress. Noticed my ankles are swelling and no BM for about a wk and a half.

## 2012-09-07 NOTE — Progress Notes (Signed)
Kim Shaw CNM notified of pt's admission and status. Will see pt 

## 2012-09-09 ENCOUNTER — Ambulatory Visit (HOSPITAL_COMMUNITY): Admit: 2012-09-09 | Payer: Medicaid Other

## 2012-10-08 ENCOUNTER — Encounter: Payer: Self-pay | Admitting: *Deleted

## 2012-10-14 ENCOUNTER — Encounter: Payer: Self-pay | Admitting: Family

## 2012-10-14 ENCOUNTER — Ambulatory Visit (INDEPENDENT_AMBULATORY_CARE_PROVIDER_SITE_OTHER): Payer: Medicaid Other | Admitting: Family

## 2012-10-14 VITALS — BP 119/83 | HR 68 | Temp 98.7°F

## 2012-10-14 DIAGNOSIS — Z3042 Encounter for surveillance of injectable contraceptive: Secondary | ICD-10-CM

## 2012-10-14 DIAGNOSIS — Z3049 Encounter for surveillance of other contraceptives: Secondary | ICD-10-CM

## 2012-10-14 MED ORDER — MEDROXYPROGESTERONE ACETATE 150 MG/ML IM SUSP
150.0000 mg | INTRAMUSCULAR | Status: DC
  Administered 2012-10-14: 150 mg via INTRAMUSCULAR

## 2012-10-14 NOTE — Progress Notes (Signed)
  Subjective:     Valerie Reyes is a 27 y.o. female who presents for a postpartum visit. She is 6 weeks postpartum following a low cervical transverse Cesarean section for chorioamnionitis and failure to progress. I have fully reviewed the prenatal and intrapartum course. The delivery was at 41 gestational weeks. Outcome: primary cesarean section, low transverse incision. Anesthesia: epidural. Postpartum course has been unremarkable. Baby's course has been unremarkable. Baby is feeding by both breast and bottle - Gerber gentle. Bleeding no bleeding. Bowel function is normal. Bladder function is normal. Patient is sexually active, last unprotected intercourse was July 20th.  Contraception method is none. Postpartum depression screening: negative.  Last pap smear February 2014 > negative.  The following portions of the patient's history were reviewed and updated as appropriate: allergies, current medications, past family history, past medical history, past social history, past surgical history and problem list.  Review of Systems Pertinent items are noted in HPI.   Objective:    BP 119/83  Pulse 68  Temp(Src) 98.7 F (37.1 C) (Oral)  Breastfeeding? Yes        General:  alert, cooperative and appears stated age   Breasts:  inspection negative, no nipple discharge or bleeding, no masses or nodularity palpable  Lungs: clear to auscultation bilaterally  Heart:  regular rate and rhythm, S1, S2 normal, no murmur, click, rub or gallop  Abdomen: soft, non-tender; bowel sounds normal; no masses,  no organomegaly   Vulva:  normal  Vagina: normal vagina, no discharge, exudate, lesion, or erythema; healed well  Cervix:  no cervical motion tenderness  Corpus: normal size, contour, position, consistency, mobility, non-tender  Adnexa:  normal adnexa  Rectal Exam: Not performed.   Urine pregnancy test: negative Assessment:     Normal postpartum exam. Pap smear not done at today's visit.   Plan:     1. Contraception: Depo-Provera injections 2. Follow up in: 3 months or as needed.

## 2012-12-30 ENCOUNTER — Ambulatory Visit: Payer: Medicaid Other

## 2013-01-01 ENCOUNTER — Ambulatory Visit (INDEPENDENT_AMBULATORY_CARE_PROVIDER_SITE_OTHER): Payer: Medicaid Other

## 2013-01-01 VITALS — BP 104/72 | HR 77 | Temp 98.5°F | Wt 141.4 lb

## 2013-01-01 DIAGNOSIS — Z3049 Encounter for surveillance of other contraceptives: Secondary | ICD-10-CM

## 2013-01-01 MED ORDER — MEDROXYPROGESTERONE ACETATE 104 MG/0.65ML ~~LOC~~ SUSP
104.0000 mg | Freq: Once | SUBCUTANEOUS | Status: AC
  Administered 2013-01-01: 104 mg via SUBCUTANEOUS

## 2013-01-01 NOTE — Progress Notes (Signed)
Pt. Here for depo injection today. Pt. States she has been spotting. Informed pt. That this is normal and may occur while she is on depo. Pt. Verbalized understanding and stated she had no other concerns. Pt. Tolerated injection well.

## 2013-01-02 ENCOUNTER — Other Ambulatory Visit: Payer: Self-pay

## 2013-01-11 ENCOUNTER — Encounter (HOSPITAL_COMMUNITY): Payer: Self-pay | Admitting: Emergency Medicine

## 2013-01-11 ENCOUNTER — Encounter (HOSPITAL_COMMUNITY): Payer: Self-pay | Admitting: *Deleted

## 2013-01-11 ENCOUNTER — Inpatient Hospital Stay (EMERGENCY_DEPARTMENT_HOSPITAL)
Admission: AD | Admit: 2013-01-11 | Discharge: 2013-01-11 | Disposition: A | Discharge status: Home / Self Care | Payer: Medicaid Other | Source: Ambulatory Visit | Attending: Obstetrics and Gynecology | Admitting: Obstetrics and Gynecology

## 2013-01-11 ENCOUNTER — Emergency Department (HOSPITAL_COMMUNITY)
Admission: EM | Admit: 2013-01-11 | Discharge: 2013-01-11 | Disposition: A | Discharge status: Home / Self Care | Payer: Medicaid Other | Attending: Emergency Medicine | Admitting: Emergency Medicine

## 2013-01-11 DIAGNOSIS — Z8709 Personal history of other diseases of the respiratory system: Secondary | ICD-10-CM | POA: Insufficient documentation

## 2013-01-11 DIAGNOSIS — K0889 Other specified disorders of teeth and supporting structures: Secondary | ICD-10-CM

## 2013-01-11 DIAGNOSIS — K089 Disorder of teeth and supporting structures, unspecified: Secondary | ICD-10-CM | POA: Insufficient documentation

## 2013-01-11 DIAGNOSIS — B373 Candidiasis of vulva and vagina: Secondary | ICD-10-CM

## 2013-01-11 DIAGNOSIS — N760 Acute vaginitis: Secondary | ICD-10-CM

## 2013-01-11 DIAGNOSIS — Z87891 Personal history of nicotine dependence: Secondary | ICD-10-CM | POA: Insufficient documentation

## 2013-01-11 DIAGNOSIS — Z79899 Other long term (current) drug therapy: Secondary | ICD-10-CM | POA: Insufficient documentation

## 2013-01-11 DIAGNOSIS — A499 Bacterial infection, unspecified: Secondary | ICD-10-CM

## 2013-01-11 DIAGNOSIS — B9689 Other specified bacterial agents as the cause of diseases classified elsewhere: Secondary | ICD-10-CM

## 2013-01-11 LAB — URINALYSIS, ROUTINE W REFLEX MICROSCOPIC
Glucose, UA: NEGATIVE mg/dL
Hgb urine dipstick: NEGATIVE
Specific Gravity, Urine: >1.03 — ABNORMAL HIGH (ref 1.005–1.030)

## 2013-01-11 LAB — WET PREP, GENITAL: Trich, Wet Prep: NONE SEEN

## 2013-01-11 LAB — POCT PREGNANCY, URINE: Preg Test, Ur: NEGATIVE

## 2013-01-11 MED ORDER — TRAMADOL HCL 50 MG PO TABS: 50.0000 mg | ORAL_TABLET | Freq: Four times a day (QID) | ORAL | Status: DC | PRN

## 2013-01-11 MED ORDER — PENICILLIN V POTASSIUM 500 MG PO TABS: 500.0000 mg | ORAL_TABLET | Freq: Four times a day (QID) | ORAL | Status: AC

## 2013-01-11 MED ORDER — METRONIDAZOLE 500 MG PO TABS: 500.0000 mg | ORAL_TABLET | Freq: Two times a day (BID) | ORAL | Status: DC

## 2013-01-11 MED ORDER — FLUCONAZOLE 150 MG PO TABS: 150.0000 mg | ORAL_TABLET | Freq: Every day | ORAL | Status: DC

## 2013-01-11 NOTE — MAU Note (Signed)
I have a major toothache and my appt is not until end of November. I took 600 Ibuprofen last night and woke up this am with stomach pains. Vomited blood once this am and now am nauseated. No diarrhea

## 2013-01-11 NOTE — ED Provider Notes (Signed)
Medical screening examination/treatment/procedure(s) were performed by non-physician practitioner and as supervising physician I was immediately available for consultation/collaboration.    Antwoin Lackey R Iliza Blankenbeckler, MD 01/11/13 2348 

## 2013-01-11 NOTE — MAU Provider Note (Signed)
History     CSN: [id number]  Arrival date and time: 01/11/13 4098   First Provider Initiated Contact with Patient 01/11/13 2003      Chief Complaint  Patient presents with  . Dental Pain  . Abdominal Pain   HPI Valerie Reyes is  A 27 y.o. G1P1001. Depo Provera for BC. Pt has tooth pain, has an appt 11/24 fo rremoval of upper wisdom teeth. She has been taking Ibuprofen 600 mg 1-2x/d with relief. Last night she took two 600 mg tabs, didn't help with the pain and she woke up with epigastric pain and vomited blood x 1. She is nauseated, no diarrhea or fever. She has noticed vaginal itching recently, brown discharge from Depo.  Nor currently sexually active. Pt of GYN Clinic.     Past Medical History  Diagnosis Date  . Pregnancy   . Bronchitis   . Medical history non-contributory     Past Surgical History  Procedure Laterality Date  . No past surgeries    . Cesarean section N/A 09/01/2012    Procedure: CESAREAN SECTION;  Surgeon: Lesly Dukes, MD;  Location: [facility];  Service: Obstetrics;  Laterality: N/A;    Family History  Problem Relation Age of Onset  . Cancer Mother   . Diabetes Mother   . Hypertension Mother   . Cancer Maternal Aunt     lung  . Diabetes Maternal Grandmother   . Stroke Maternal Grandmother   . Hypertension Maternal Grandmother   . Hypertension Paternal Grandmother   . Diabetes Paternal Grandmother     History  Substance Use Topics  . Smoking status: Former Smoker -- 1.00 packs/day    Types: Cigarettes    Quit date: 04/09/2012  . Smokeless tobacco: Not on file  . Alcohol Use: No    Allergies: No Known Allergies  Facility-administered medications prior to admission  Medication Dose Route Frequency Provider Last Rate Last Dose  . medroxyPROGESTERone (DEPO-PROVERA) injection 150 mg  150 mg Intramuscular Q90 days Walidah N Muhammad, CNM   150 mg at 10/14/12 1530   Prescriptions prior to admission  Medication Sig Dispense Refill  .  ibuprofen (ADVIL,MOTRIN) 600 MG tablet Take 1 tablet (600 mg total) by mouth every 6 (six) hours.  30 tablet  0  . naproxen sodium (ANAPROX) 220 MG tablet Take 220 mg by mouth as needed.      Marland Kitchen oxyCODONE-acetaminophen (PERCOCET/ROXICET) 5-325 MG per tablet Take 1-2 tablets by mouth every 4 (four) hours as needed.  15 tablet  0  . polyethylene glycol (MIRALAX / GLYCOLAX) packet Take 17 g by mouth 2 (two) times daily. Until stooling regularly  30 packet  0  . Prenatal Vit-Fe Fumarate-FA (PRENATAL COMPLETE) 14-0.4 MG TABS Take 1 tablet by mouth daily.  60 each  3    Review of Systems  Constitutional: Negative for fever and chills.  HENT:       Tooth pain  Gastrointestinal: Positive for nausea and vomiting. Negative for diarrhea and constipation.  Genitourinary: Negative for dysuria, urgency and frequency.   Physical Exam   Blood pressure 136/81, pulse 79, temperature 98.8 F (37.1 C), resp. rate 20, height 5\' 1"  (1.549 m), weight 145 lb (65.772 kg), SpO2 100.00%.  Physical Exam  Constitutional: She is oriented to person, place, and time. She appears well-developed and well-nourished. No distress.  GI: Soft. She exhibits no distension and no mass. There is no tenderness. There is no rebound and no guarding.  Genitourinary:  Pelvic exam:  Ext gen- nl anatomy, skin intact Vagina- mod amt coffee ground like discharge Cx- closed Uterus- nl size, non tender Adn- nontender, no masses palp  Musculoskeletal: Normal range of motion.  Neurological: She is alert and oriented to person, place, and time.  Skin: Skin is warm and dry. She is not diaphoretic.  Psychiatric: She has a normal mood and affect. Her behavior is normal.    MAU Course  Procedures  MDM Results for orders placed during the hospital encounter of 01/11/13 (from the past 24 hour(s))  URINALYSIS, ROUTINE W REFLEX MICROSCOPIC     Status: Abnormal   Collection Time    01/11/13  7:38 PM      Result Value Range   Color, Urine  YELLOW  YELLOW   APPearance CLEAR  CLEAR   Specific Gravity, Urine >1.030 (*) 1.005 - 1.030   pH 6.0  5.0 - 8.0   Glucose, UA NEGATIVE  NEGATIVE mg/dL   Hgb urine dipstick NEGATIVE  NEGATIVE   Bilirubin Urine NEGATIVE  NEGATIVE   Ketones, ur NEGATIVE  NEGATIVE mg/dL   Protein, ur NEGATIVE  NEGATIVE mg/dL   Urobilinogen, UA 0.2  0.0 - 1.0 mg/dL   Nitrite NEGATIVE  NEGATIVE   Leukocytes, UA NEGATIVE  NEGATIVE  POCT PREGNANCY, URINE     Status: None   Collection Time    01/11/13  7:49 PM      Result Value Range   Preg Test, Ur NEGATIVE  NEGATIVE  WET PREP, GENITAL     Status: Abnormal   Collection Time    01/11/13  8:21 PM      Result Value Range   Yeast Wet Prep HPF POC FEW (*) NONE SEEN   Trich, Wet Prep NONE SEEN  NONE SEEN   Clue Cells Wet Prep HPF POC FEW (*) NONE SEEN   WBC, Wet Prep HPF POC FEW (*) NONE SEEN     Assessment and Plan  ASSESSMENT:  Vaginal yeast BV Tooth pain  PLAN:  Diflucan 150 mg # 2 Flagyl for BV May go to [facility] Ed if desires evaluation and tx of tooth pain  Valerie Reyes M. 01/11/2013, 8:10 PM

## 2013-01-11 NOTE — Progress Notes (Signed)
Wet prep only obtained and sent

## 2013-01-11 NOTE — ED Provider Notes (Signed)
CSN: [id number]     Arrival date & time 01/11/13  2125 History   This chart was scribed for non-physician practitioner Trixie Dredge, PA-C, working with Celene Kras, MD, by Yevette Edwards, ED Scribe. This patient was seen in room WTR7/WTR7 and the patient's care was started at 10:19 PM.  First MD Initiated Contact with Patient 01/11/13 2202     No chief complaint on file.  Patient is a 27 y.o. female presenting with tooth pain. The history is provided by the patient. No language interpreter was used.  Dental Pain Location:  Upper Quality:  Constant and shooting Severity:  Moderate Onset quality:  Sudden Duration:  4 days Timing:  Constant Progression:  Unchanged Chronicity:  New Relieved by:  Nothing Worsened by:  Nothing tried Ineffective treatments:  Acetaminophen and NSAIDs Associated symptoms: facial swelling   Associated symptoms: no fever    HPI Comments: Valerie Reyes is a 27 y.o. female who presents to the Emergency Department complaining of acute upper right-sided dental pain which began three days ago. The pt states the pain is "stabbing" and persistent.  She states she has experienced mild facial swelling in addition to the dental pain. The pt has treated the pain with IBU, Aleve, and tylenol without resolution. She reports she has an appointment to have the tooth extracted in ten days. She denies any fever, cough, sore throat, difficulty swallowing, or anomalous taste to her mouth. The pt has experienced similar pain to a different tooth, but she denies any previous problems with the affected tooth. She also denies any allergies. The pt is a former smoker. She is not breast-feeding.   Past Medical History  Diagnosis Date  . Pregnancy   . Bronchitis   . Medical history non-contributory    Past Surgical History  Procedure Laterality Date  . No past surgeries    . Cesarean section N/A 09/01/2012    Procedure: CESAREAN SECTION;  Surgeon: Lesly Dukes, MD;  Location: [facility];  Service: Obstetrics;  Laterality: N/A;   Family History  Problem Relation Age of Onset  . Cancer Mother   . Diabetes Mother   . Hypertension Mother   . Cancer Maternal Aunt     lung  . Diabetes Maternal Grandmother   . Stroke Maternal Grandmother   . Hypertension Maternal Grandmother   . Hypertension Paternal Grandmother   . Diabetes Paternal Grandmother    History  Substance Use Topics  . Smoking status: Former Smoker -- 1.00 packs/day    Types: Cigarettes    Quit date: 04/09/2012  . Smokeless tobacco: Not on file  . Alcohol Use: No   OB History   Grav Para Term Preterm Abortions TAB SAB Ect Mult Living   1 1 1  0 0 0 0 0 0 1     Review of Systems  Constitutional: Negative for fever.  HENT: Positive for dental problem and facial swelling. Negative for sore throat and trouble swallowing.   Respiratory: Negative for cough.     Allergies  Review of patient's allergies indicates no known allergies.  Home Medications   Current Outpatient Rx  Name  Route  Sig  Dispense  Refill  . fluconazole (DIFLUCAN) 150 MG tablet   Oral   Take 1 tablet (150 mg total) by mouth daily.   2 tablet   0   . ibuprofen (ADVIL,MOTRIN) 600 MG tablet   Oral   Take 1 tablet (600 mg total) by mouth every 6 (six) hours.  30 tablet   0   . metroNIDAZOLE (FLAGYL) 500 MG tablet   Oral   Take 1 tablet (500 mg total) by mouth 2 (two) times daily.   14 tablet   0   . naproxen sodium (ANAPROX) 220 MG tablet   Oral   Take 220 mg by mouth daily as needed (pain).           Triage Vitals: BP 109/82  Pulse 80  Temp(Src) 98.4 F (36.9 C) (Oral)  Resp 18  Ht 5\' 1"  (1.549 m)  Wt 145 lb (65.772 kg)  BMI 27.41 kg/m2  SpO2 99%  Physical Exam  Nursing note and vitals reviewed. Constitutional: She appears well-developed and well-nourished. No distress.  HENT:  Head: Normocephalic and atraumatic.  Mouth/Throat: Oropharynx is clear and moist. No oropharyngeal exudate.    No facial  swelling  Neck: Neck supple.  Pulmonary/Chest: Effort normal.  Neurological: She is alert.  Skin: She is not diaphoretic.    ED Course  Procedures (including critical care time)  DIAGNOSTIC STUDIES: Oxygen Saturation is 100% on room air, normal by my interpretation.    COORDINATION OF CARE:  10:26 PM- Discussed treatment plan with patient, and the patient agreed to the plan.   Labs Review Labs Reviewed - No data to display Imaging Review No results found.  EKG Interpretation   None       MDM  No diagnosis found.  Pt with 4 days of constant dental pain without fevers or airway concerns.  Has dental appointment for extractions on 11/24.  Concerned about abscess with sudden onset of pain.  No obvious signs of abscess but will treat with penicillin.  Discussed  findings, treatment, and follow up  with patient.  Pt given return precautions.  Pt verbalizes understanding and agrees with plan.      I doubt any other EMC precluding discharge at this time including, but not necessarily limited to the following: deep space head or neck infection, ludwig's angina  I personally performed the services described in this documentation, which was scribed in my presence. The recorded information has been reviewed and is accurate.    Trixie Dredge, PA-C 01/11/13 2345

## 2013-01-11 NOTE — MAU Note (Signed)
Tooth hurting is top right tooth far back. Has appt 11/24 at [facility]

## 2013-01-11 NOTE — Progress Notes (Signed)
Written and verbal d/c instructions given and understanding voiced. 

## 2013-01-11 NOTE — ED Notes (Signed)
Per pt report: pt c/o dental pain since Wednesday.  Pt has a dentist appointment scheduled for nov. 25.  Pt reports reports right upper tooth in the back.

## 2013-03-26 ENCOUNTER — Ambulatory Visit: Payer: Medicaid Other
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
## 2013-06-18 ENCOUNTER — Other Ambulatory Visit (HOSPITAL_COMMUNITY)
Admission: RE | Admit: 2013-06-18 | Discharge: 2013-06-18 | Disposition: A | Discharge status: Home / Self Care | Payer: Self-pay | Source: Ambulatory Visit | Attending: Family Medicine | Admitting: Family Medicine

## 2013-06-18 ENCOUNTER — Encounter (HOSPITAL_COMMUNITY): Payer: Self-pay | Admitting: Emergency Medicine

## 2013-06-18 ENCOUNTER — Emergency Department (INDEPENDENT_AMBULATORY_CARE_PROVIDER_SITE_OTHER)
Admission: EM | Admit: 2013-06-18 | Discharge: 2013-06-18 | Disposition: A | Discharge status: Home / Self Care | Payer: Self-pay | Source: Home / Self Care | Attending: Family Medicine | Admitting: Family Medicine

## 2013-06-18 DIAGNOSIS — N760 Acute vaginitis: Secondary | ICD-10-CM | POA: Insufficient documentation

## 2013-06-18 DIAGNOSIS — N730 Acute parametritis and pelvic cellulitis: Secondary | ICD-10-CM

## 2013-06-18 DIAGNOSIS — Z113 Encounter for screening for infections with a predominantly sexual mode of transmission: Secondary | ICD-10-CM | POA: Insufficient documentation

## 2013-06-18 LAB — CERVICOVAGINAL ANCILLARY ONLY
WET PREP (BD AFFIRM): NEGATIVE
WET PREP (BD AFFIRM): NEGATIVE
Wet Prep (BD Affirm): NEGATIVE

## 2013-06-18 LAB — POCT URINALYSIS DIP (DEVICE)
BILIRUBIN URINE: NEGATIVE
GLUCOSE, UA: NEGATIVE mg/dL
Ketones, ur: NEGATIVE mg/dL
LEUKOCYTES UA: NEGATIVE
NITRITE: POSITIVE — AB
Protein, ur: NEGATIVE mg/dL
Specific Gravity, Urine: >=1.03 (ref 1.005–1.030)
UROBILINOGEN UA: 0.2 mg/dL (ref 0.0–1.0)
pH: 5.5 (ref 5.0–8.0)

## 2013-06-18 LAB — POCT PREGNANCY, URINE: PREG TEST UR: NEGATIVE

## 2013-06-18 LAB — HIV ANTIBODY (ROUTINE TESTING W REFLEX): HIV: NONREACTIVE

## 2013-06-18 LAB — RPR

## 2013-06-18 MED ORDER — AZITHROMYCIN 250 MG PO TABS
ORAL_TABLET | ORAL | Status: AC
  Filled 2013-06-18: qty 4

## 2013-06-18 MED ORDER — NORGESTIMATE-ETH ESTRADIOL 0.25-35 MG-MCG PO TABS: 1.0000 | ORAL_TABLET | Freq: Every day | ORAL | Status: DC

## 2013-06-18 MED ORDER — AZITHROMYCIN 250 MG PO TABS
1000.0000 mg | ORAL_TABLET | Freq: Once | ORAL | Status: AC
  Administered 2013-06-18: 1000 mg via ORAL

## 2013-06-18 MED ORDER — AZITHROMYCIN 250 MG PO TABS: 1000.0000 mg | ORAL_TABLET | Freq: Once | ORAL | Status: DC

## 2013-06-18 MED ORDER — METRONIDAZOLE 500 MG PO TABS: 500.0000 mg | ORAL_TABLET | Freq: Two times a day (BID) | ORAL | Status: DC

## 2013-06-18 MED ORDER — CEFTRIAXONE SODIUM 250 MG IJ SOLR
INTRAMUSCULAR | Status: AC
  Filled 2013-06-18: qty 250

## 2013-06-18 MED ORDER — CEFTRIAXONE SODIUM 250 MG IJ SOLR
250.0000 mg | Freq: Once | INTRAMUSCULAR | Status: AC
  Administered 2013-06-18: 250 mg via INTRAMUSCULAR

## 2013-06-18 NOTE — Discharge Instructions (Signed)
Thank you for coming in today. Take metronidazole twice daily for one week.  Take azithromycin in one week once.  I have given the one month's worth of birth control pills to get you to your Implanon appointment.  If your belly pain worsens, or you have high fever, bad vomiting, blood in your stool or black tarry stool go to the Emergency Room.   Pelvic Inflammatory Disease Pelvic inflammatory disease (PID) refers to an infection in some or all of the female organs. The infection can be in the uterus, ovaries, fallopian tubes, or the surrounding tissues in the pelvis. PID can cause abdominal or pelvic pain that comes on suddenly (acute pelvic pain). PID is a serious infection because it can lead to lasting (chronic) pelvic pain or the inability to have children (infertile).  CAUSES  The infection is often caused by the normal bacteria found in the vaginal tissues. PID may also be caused by an infection that is spread during sexual contact. PID can also occur following:   The birth of a baby.   A miscarriage.   An abortion.   Major pelvic surgery.   The use of an intrauterine device (IUD).   A sexual assault.  RISK FACTORS Certain factors can put a person at higher risk for PID, such as:  Being younger than 25 years.  Being sexually active at [place] age.  Usingnonbarrier contraception.  Havingmultiple sexual partners.  Having sex with someone who has symptoms of a genital infection.  Using oral contraception. Other times, certain behaviors can increase the possibility of getting PID, such as:  Having sex during your period.  Using a vaginal douche.  Having an intrauterine device (IUD) in place. SYMPTOMS   Abdominal or pelvic pain.   Fever.   Chills.   Abnormal vaginal discharge.  Abnormal uterine bleeding.   Unusual pain shortly after finishing your period. DIAGNOSIS  Your caregiver will choose some of the following methods to make a diagnosis,  such as:   Performinga physical exam and history. A pelvic exam typically reveals a very tender uterus and surrounding pelvis.   Ordering laboratory tests including a pregnancy test, blood tests, and urine test.  Orderingcultures of the vagina and cervix to check for a sexually transmitted infection (STI).  Performing an ultrasound.   Performing a laparoscopic procedure to look inside the pelvis.  TREATMENT   Antibiotic medicines may be prescribed and taken by mouth.   Sexual partners may be treated when the infection is caused by a sexually transmitted disease (STD).   Hospitalization may be needed to give antibiotics intravenously.  Surgery may be needed, but this is rare. It may take weeks until you are completely well. If you are diagnosed with PID, you should also be checked for human immunodeficiency virus (HIV). HOME CARE INSTRUCTIONS   If given, take your antibiotics as directed. Finish the medicine even if you start to feel better.   Only take over-the-counter or prescription medicines for pain, discomfort, or fever as directed by your caregiver.   Do not have sexual intercourse until treatment is completed or as directed by your caregiver. If PID is confirmed, your recent sexual partner(s) will need treatment.   Keep your follow-up appointments. SEEK MEDICAL CARE IF:   You have increased or abnormal vaginal discharge.   You need prescription medicine for your pain.   You vomit.   You cannot take your medicines.   Your partner has an STD.  SEEK IMMEDIATE MEDICAL CARE IF:  You have a fever.   You have increased abdominal or pelvic pain.   You have chills.   You have pain when you urinate.   You are not better after 72 hours following treatment.  MAKE SURE YOU:   Understand these instructions.  Will watch your condition.  Will get help right away if you are not doing well or get worse. Document Released: 02/13/2005 Document  Revised: 06/10/2012 Document Reviewed: 02/09/2011 [facility] [place] [facility] Patient Information 2014 [place], [place].

## 2013-06-18 NOTE — ED Notes (Signed)
Call back number verified.  

## 2013-06-18 NOTE — ED Provider Notes (Signed)
Valerie Reyes is a 28 y.o. female who presents to Urgent Care today for vaginal discharge. Patient has had a few days of vaginal discharge associated with mild bilateral lower pelvic pain. No fevers chills nausea vomiting diarrhea or dysuria. Discharge is whitish. Boyfriend recently exposed to gonorrhea.  Patient also notes that she is scheduled to have an Implanon placed in about one month. She has run out of her oral contraception and would like birth control pills until she can be seen by her obstetrician. She notes that she is somewhat inconsistent with use of condoms.  Past Medical History  Diagnosis Date  . Pregnancy   . Bronchitis   . Medical history non-contributory    History  Substance Use Topics  . Smoking status: Former Smoker -- 1.00 packs/day    Types: Cigarettes    Quit date: 04/09/2012  . Smokeless tobacco: Not on file  . Alcohol Use: No   ROS as above Medications: Current Facility-Administered Medications  Medication Dose Route Frequency Provider Last Rate Last Dose  . medroxyPROGESTERone (DEPO-PROVERA) injection 150 mg  150 mg Intramuscular Q90 days Walidah N Muhammad, CNM   150 mg at 10/14/12 1530   Current Outpatient Prescriptions  Medication Sig Dispense Refill  . fluconazole (DIFLUCAN) 150 MG tablet Take 1 tablet (150 mg total) by mouth daily.  2 tablet  0  . ibuprofen (ADVIL,MOTRIN) 600 MG tablet Take 1 tablet (600 mg total) by mouth every 6 (six) hours.  30 tablet  0  . metroNIDAZOLE (FLAGYL) 500 MG tablet Take 1 tablet (500 mg total) by mouth 2 (two) times daily.  14 tablet  0  . naproxen sodium (ANAPROX) 220 MG tablet Take 220 mg by mouth daily as needed (pain).       . traMADol (ULTRAM) 50 MG tablet Take 1 tablet (50 mg total) by mouth every 6 (six) hours as needed.  15 tablet  0    Exam:  BP 128/85  Pulse 75  Temp(Src) 98.3 F (36.8 C) (Oral)  Resp 16  SpO2 98%  LMP 06/12/2013 Gen: Well NAD HEENT: EOMI,  MMM Lungs: Normal work of breathing.  CTABL Heart: RRR no MRG Abd: NABS, Soft. NT, ND Exts: Brisk capillary refill, warm and well perfused.  GYN: Normal external genitalia. Vaginal canal with thin white discharge normal-appearing cervix. No cervical motion tenderness. Mild right-sided adnexal tenderness. Nontender in the left.   Assessment and Plan: 28 y.o. female with pelvic inflammatory disease. Very mild. Plan for treatment with ceftriaxone and azithromycin with repeat 1 g course in one week, and metronidazole. Cytology pending as well as HIV and RPR. Followup with OB/GYN. One month oral contraception provided.  Discussed warning signs or symptoms. Please see discharge instructions. Patient expresses understanding.    Rodolph BongEvan S Raider Valbuena, MD 06/18/13 1320

## 2013-06-18 NOTE — ED Notes (Signed)
Pt reports poss exposure to STD; Gonorrhea/Chlam Reports her BF has been exposed to Gonorrhea Sx today include white vag d/c w/some mild abd pain Denies f/v/n/d, urinary sx Alert w/no signs of acute distress.

## 2013-06-19 LAB — CERVICOVAGINAL ANCILLARY ONLY
CHLAMYDIA, DNA PROBE: POSITIVE — AB
NEISSERIA GONORRHEA: POSITIVE — AB

## 2013-06-21 ENCOUNTER — Telehealth (HOSPITAL_COMMUNITY): Payer: Self-pay | Admitting: *Deleted

## 2013-06-21 NOTE — ED Notes (Signed)
GC and Chlamydia pos., Affirm: all neg.,  HIV/RPR non-reactive.  Pt. Adequately treated with Rocephin and Zithromax.  I called pt. Pt. verified x 2 and given results.  Pt. Told she was adequately treated.  Pt. instructed to notify her partner, no sex for 1 week and to practice safe sex. Pt. told she [facility] mos.  Pt. voiced understanding and said her partner came in with her and was treated. DHHS forms x 2 completed and [facility]. Desiree LucySuzanne M [facility] 06/21/2013

## 2013-06-28 ENCOUNTER — Other Ambulatory Visit: Payer: Self-pay | Admitting: Family Medicine

## 2013-12-24 ENCOUNTER — Encounter: Payer: Self-pay | Admitting: Obstetrics & Gynecology

## 2013-12-29 ENCOUNTER — Encounter (HOSPITAL_COMMUNITY): Payer: Self-pay | Admitting: Emergency Medicine

## 2014-01-10 ENCOUNTER — Emergency Department (HOSPITAL_BASED_OUTPATIENT_CLINIC_OR_DEPARTMENT_OTHER)
Admission: EM | Admit: 2014-01-10 | Discharge: 2014-01-10 | Disposition: A | Discharge status: Home / Self Care | Payer: No Typology Code available for payment source | Attending: Emergency Medicine | Admitting: Emergency Medicine

## 2014-01-10 ENCOUNTER — Encounter (HOSPITAL_BASED_OUTPATIENT_CLINIC_OR_DEPARTMENT_OTHER): Payer: Self-pay | Admitting: *Deleted

## 2014-01-10 DIAGNOSIS — Z79818 Long term (current) use of other agents affecting estrogen receptors and estrogen levels: Secondary | ICD-10-CM | POA: Insufficient documentation

## 2014-01-10 DIAGNOSIS — Z3202 Encounter for pregnancy test, result negative: Secondary | ICD-10-CM | POA: Insufficient documentation

## 2014-01-10 DIAGNOSIS — A599 Trichomoniasis, unspecified: Secondary | ICD-10-CM

## 2014-01-10 DIAGNOSIS — Z8709 Personal history of other diseases of the respiratory system: Secondary | ICD-10-CM | POA: Insufficient documentation

## 2014-01-10 DIAGNOSIS — Z720 Tobacco use: Secondary | ICD-10-CM | POA: Insufficient documentation

## 2014-01-10 DIAGNOSIS — Z791 Long term (current) use of non-steroidal anti-inflammatories (NSAID): Secondary | ICD-10-CM | POA: Insufficient documentation

## 2014-01-10 DIAGNOSIS — Z792 Long term (current) use of antibiotics: Secondary | ICD-10-CM | POA: Insufficient documentation

## 2014-01-10 DIAGNOSIS — Z202 Contact with and (suspected) exposure to infections with a predominantly sexual mode of transmission: Secondary | ICD-10-CM

## 2014-01-10 DIAGNOSIS — A5901 Trichomonal vulvovaginitis: Secondary | ICD-10-CM | POA: Insufficient documentation

## 2014-01-10 DIAGNOSIS — N390 Urinary tract infection, site not specified: Secondary | ICD-10-CM

## 2014-01-10 LAB — URINALYSIS, ROUTINE W REFLEX MICROSCOPIC
BILIRUBIN URINE: NEGATIVE
GLUCOSE, UA: NEGATIVE mg/dL
HGB URINE DIPSTICK: NEGATIVE
Ketones, ur: 15 mg/dL — AB
Nitrite: POSITIVE — AB
PROTEIN: 30 mg/dL — AB
Specific Gravity, Urine: 1.027 (ref 1.005–1.030)
UROBILINOGEN UA: 1 mg/dL (ref 0.0–1.0)
pH: 6.5 (ref 5.0–8.0)

## 2014-01-10 LAB — WET PREP, GENITAL: YEAST WET PREP: NONE SEEN

## 2014-01-10 LAB — URINE MICROSCOPIC-ADD ON

## 2014-01-10 LAB — PREGNANCY, URINE: PREG TEST UR: NEGATIVE

## 2014-01-10 MED ORDER — AZITHROMYCIN 250 MG PO TABS
1000.0000 mg | ORAL_TABLET | Freq: Once | ORAL | Status: AC
  Administered 2014-01-10: 1000 mg via ORAL
  Filled 2014-01-10: qty 4

## 2014-01-10 MED ORDER — CEPHALEXIN 500 MG PO CAPS: 500.0000 mg | ORAL_CAPSULE | Freq: Four times a day (QID) | ORAL | Status: DC

## 2014-01-10 MED ORDER — METRONIDAZOLE 500 MG PO TABS
2000.0000 mg | ORAL_TABLET | Freq: Once | ORAL | Status: AC
  Administered 2014-01-10: 2000 mg via ORAL
  Filled 2014-01-10: qty 4

## 2014-01-10 MED ORDER — LIDOCAINE HCL (PF) 1 % IJ SOLN
INTRAMUSCULAR | Status: AC
  Administered 2014-01-10: 0.9 mL
  Filled 2014-01-10: qty 5

## 2014-01-10 MED ORDER — CEFTRIAXONE SODIUM 250 MG IJ SOLR
250.0000 mg | Freq: Once | INTRAMUSCULAR | Status: AC
  Administered 2014-01-10: 250 mg via INTRAMUSCULAR
  Filled 2014-01-10: qty 250

## 2014-01-10 MED ORDER — ONDANSETRON 4 MG PO TBDP
4.0000 mg | ORAL_TABLET | Freq: Once | ORAL | Status: AC
  Administered 2014-01-10: 4 mg via ORAL
  Filled 2014-01-10: qty 1

## 2014-01-10 NOTE — ED Notes (Signed)
Patient states that she is having painful urination and unsure if she has a UTI or an STD

## 2014-01-10 NOTE — Discharge Instructions (Signed)
Take keflex as directed until gone for your UTI. Refer to attached documents for more information. Follow up with your doctor as needed.

## 2014-01-10 NOTE — ED Provider Notes (Signed)
CSN: [id number]     Arrival date & time 01/10/14  1451 History   First MD Initiated Contact with Patient 01/10/14 1522     Chief Complaint  Patient presents with  . Urinary Frequency     (Consider location/radiation/quality/duration/timing/severity/associated sxs/prior Treatment) HPI Comments: Patient reports her sexual partner told her that he tested positive for gonorrhea and chlamydia.   Patient is a 28 y.o. female presenting with vaginal discharge. The history is provided by the patient. No language interpreter was used.  Vaginal Discharge Quality:  Yellow, thick and malodorous Severity:  Moderate Onset quality:  Gradual Duration:  3 days Timing:  Constant Progression:  Unchanged Chronicity:  New Context: spontaneously   Context: not after intercourse, not after urination, not at rest, not during intercourse, not during pregnancy and not during urination   Relieved by:  Nothing Worsened by:  Nothing tried Ineffective treatments:  None tried Associated symptoms: dysuria   Associated symptoms: no abdominal pain, no fever, no nausea and no vomiting   Dysuria:    Severity:  Moderate   Onset quality:  Gradual   Duration:  3 days   Timing:  Constant   Progression:  Unchanged   Chronicity:  New Risk factors: new sexual partner, STI exposure and unprotected sex   Risk factors: no foreign body and no gynecological surgery     Past Medical History  Diagnosis Date  . Pregnancy   . Bronchitis   . Medical history non-contributory    Past Surgical History  Procedure Laterality Date  . No past surgeries    . Cesarean section N/A 09/01/2012    Procedure: CESAREAN SECTION;  Surgeon: Lesly DukesKelly H Leggett, MD;  Location: [facility];  Service: Obstetrics;  Laterality: N/A;   Family History  Problem Relation Age of Onset  . Cancer Mother   . Diabetes Mother   . Hypertension Mother   . Cancer Maternal Aunt     lung  . Diabetes Maternal Grandmother   . Stroke Maternal Grandmother   .  Hypertension Maternal Grandmother   . Hypertension Paternal Grandmother   . Diabetes Paternal Grandmother    History  Substance Use Topics  . Smoking status: Current Every Day Smoker -- 1.00 packs/day    Types: Cigarettes    Last Attempt to Quit: 04/09/2012  . Smokeless tobacco: Not on file  . Alcohol Use: No   OB History    Gravida Para Term Preterm AB TAB SAB Ectopic Multiple Living   1 1 1  0 0 0 0 0 0 1     Review of Systems  Constitutional: Negative for fever, chills and fatigue.  HENT: Negative for trouble swallowing.   Eyes: Negative for visual disturbance.  Respiratory: Negative for shortness of breath.   Cardiovascular: Negative for chest pain and palpitations.  Gastrointestinal: Negative for nausea, vomiting, abdominal pain and diarrhea.  Genitourinary: Positive for dysuria and vaginal discharge. Negative for difficulty urinating.  Musculoskeletal: Negative for arthralgias and neck pain.  Skin: Negative for color change.  Neurological: Negative for dizziness and weakness.  Psychiatric/Behavioral: Negative for dysphoric mood.      Allergies  Review of patient's allergies indicates no known allergies.  Home Medications   Prior to Admission medications   Medication Sig Start Date End Date Taking? Authorizing Provider  azithromycin (ZITHROMAX) 250 MG tablet Take 4 tablets (1,000 mg total) by mouth once. 06/18/13   Rodolph BongEvan S Corey, MD  ibuprofen (ADVIL,MOTRIN) 600 MG tablet Take 1 tablet (600 mg total) by mouth  every 6 (six) hours. 09/04/12   Leona SingletonMaria T Thekkekandam, MD  metroNIDAZOLE (FLAGYL) 500 MG tablet Take 1 tablet (500 mg total) by mouth 2 (two) times daily. 06/18/13   Rodolph BongEvan S Corey, MD  norgestimate-ethinyl estradiol (ORTHO-CYCLEN,SPRINTEC,PREVIFEM) 0.25-35 MG-MCG tablet Take 1 tablet by mouth daily. 06/18/13   Rodolph BongEvan S Corey, MD   BP 110/80 mmHg  Pulse 89  Temp(Src) 98.2 F (36.8 C) (Oral)  Resp 18  Ht 5\' 1"  (1.549 m)  Wt 132 lb (59.875 kg)  BMI 24.95 kg/m2  SpO2  98%  LMP 01/04/2014 Physical Exam  Constitutional: She appears well-developed and well-nourished. No distress.  HENT:  Head: Normocephalic and atraumatic.  Eyes: Conjunctivae are normal.  Neck: Normal range of motion.  Cardiovascular: Normal rate and regular rhythm.  Exam reveals no gallop and no friction rub.   No murmur heard. Pulmonary/Chest: Effort normal and breath sounds normal. She has no wheezes. She has no rales. She exhibits no tenderness.  Abdominal: Soft. She exhibits no distension. There is no tenderness. There is no rebound.  Genitourinary: Vagina normal.  Copious, thick, white/yellowish vaginal disharge. Cervical os closed. No CMT. No tenderness to palpation on bimanual exam.   Musculoskeletal: Normal range of motion.  Neurological: She is alert.  Speech is goal-oriented. Moves limbs without ataxia.   Skin: Skin is warm and dry.  Psychiatric: She has a normal mood and affect. Her behavior is normal.  Nursing note and vitals reviewed.   ED Course  Procedures (including critical care time) Labs Review Labs Reviewed  WET PREP, GENITAL - Abnormal; Notable for the following:    Trich, Wet Prep FEW (*)    Clue Cells Wet Prep HPF POC FEW (*)    WBC, Wet Prep HPF POC TOO NUMEROUS TO COUNT (*)    All other components within normal limits  URINALYSIS, ROUTINE W REFLEX MICROSCOPIC - Abnormal; Notable for the following:    Color, Urine AMBER (*)    APPearance CLOUDY (*)    Ketones, ur 15 (*)    Protein, ur 30 (*)    Nitrite POSITIVE (*)    Leukocytes, UA MODERATE (*)    All other components within normal limits  URINE MICROSCOPIC-ADD ON - Abnormal; Notable for the following:    Bacteria, UA MANY (*)    All other components within normal limits  GC/CHLAMYDIA PROBE AMP  PREGNANCY, URINE    Imaging Review No results found.   EKG Interpretation None      MDM   Final diagnoses:  UTI (lower urinary tract infection)  STD exposure  Trichomonas infection     3:43 PM Urinalysis pending. Vitals stable and patient afebrile.   5:26 PM Patient has as UTI. Patient also has trichomonas. She will be treated for gonorrhea and chlamydia also. Patient will be discharged with keflex for UTI. Vitals stable and patient afebrile.   Emilia BeckKaitlyn Colvin Blatt, PA-C 01/10/14 1729  Rolan BuccoMelanie Belfi, MD 01/10/14 2300

## 2014-01-12 LAB — GC/CHLAMYDIA PROBE AMP
CT Probe RNA: POSITIVE — AB
GC PROBE AMP APTIMA: NEGATIVE

## 2014-01-13 NOTE — Telephone Encounter (Signed)
Patient + chlamydia, Rocephin and zithromax given prophylactically in ED

## 2014-01-14 ENCOUNTER — Telehealth (HOSPITAL_BASED_OUTPATIENT_CLINIC_OR_DEPARTMENT_OTHER): Payer: Self-pay | Admitting: Emergency Medicine

## 2015-04-13 ENCOUNTER — Encounter (HOSPITAL_BASED_OUTPATIENT_CLINIC_OR_DEPARTMENT_OTHER): Payer: Self-pay | Admitting: *Deleted

## 2015-04-13 ENCOUNTER — Emergency Department (HOSPITAL_BASED_OUTPATIENT_CLINIC_OR_DEPARTMENT_OTHER)
Admission: EM | Admit: 2015-04-13 | Discharge: 2015-04-13 | Disposition: A | Discharge status: Home / Self Care | Payer: No Typology Code available for payment source | Attending: Emergency Medicine | Admitting: Emergency Medicine

## 2015-04-13 DIAGNOSIS — Z79899 Other long term (current) drug therapy: Secondary | ICD-10-CM | POA: Insufficient documentation

## 2015-04-13 DIAGNOSIS — Z8709 Personal history of other diseases of the respiratory system: Secondary | ICD-10-CM | POA: Insufficient documentation

## 2015-04-13 DIAGNOSIS — B349 Viral infection, unspecified: Secondary | ICD-10-CM | POA: Insufficient documentation

## 2015-04-13 DIAGNOSIS — Z792 Long term (current) use of antibiotics: Secondary | ICD-10-CM | POA: Insufficient documentation

## 2015-04-13 DIAGNOSIS — F17210 Nicotine dependence, cigarettes, uncomplicated: Secondary | ICD-10-CM | POA: Insufficient documentation

## 2015-04-13 LAB — RAPID STREP SCREEN (MED CTR MEBANE ONLY): STREPTOCOCCUS, GROUP A SCREEN (DIRECT): NEGATIVE

## 2015-04-13 NOTE — ED Notes (Signed)
Pt given d/c instructions. Verbalizes understanding. No questions. 

## 2015-04-13 NOTE — ED Notes (Signed)
Sore throat. States her family is all sick and her throat started to hurt after she got here with her son.

## 2015-04-13 NOTE — ED Provider Notes (Signed)
CSN: [id number]     Arrival date & time 04/13/15  1246 History   First MD Initiated Contact with Patient 04/13/15 1447     Chief Complaint  Patient presents with  . Sore Throat      HPI Patient comes in with runny nose stuffy with sore throat since this morning.  Has child with similar symptoms been going on for 3 days.  Patient denies fever.  She denies nausea vomiting.  Patient denies headache. Past Medical History  Diagnosis Date  . Pregnancy   . Bronchitis   . Medical history non-contributory    Past Surgical History  Procedure Laterality Date  . No past surgeries    . Cesarean section N/A 09/01/2012    Procedure: CESAREAN SECTION;  Surgeon: Lesly Dukes, MD;  Location: [facility];  Service: Obstetrics;  Laterality: N/A;   Family History  Problem Relation Age of Onset  . Cancer Mother   . Diabetes Mother   . Hypertension Mother   . Cancer Maternal Aunt     lung  . Diabetes Maternal Grandmother   . Stroke Maternal Grandmother   . Hypertension Maternal Grandmother   . Hypertension Paternal Grandmother   . Diabetes Paternal Grandmother    Social History  Substance Use Topics  . Smoking status: Current Every Day Smoker -- 1.00 packs/day    Types: Cigarettes    Last Attempt to Quit: 04/09/2012  . Smokeless tobacco: None  . Alcohol Use: No   OB History    Gravida Para Term Preterm AB TAB SAB Ectopic Multiple Living   0 0 0 0 0 0 1     Review of Systems  All other systems reviewed and are negative  Allergies  Review of patient's allergies indicates no known allergies.  Home Medications   Prior to Admission medications   Medication Sig Start Date End Date Taking? Authorizing Provider  azithromycin (ZITHROMAX) 250 MG tablet Take 4 tablets (1,000 mg total) by mouth once. 06/18/13   Rodolph Bong, MD  cephALEXin (KEFLEX) 500 MG capsule Take 1 capsule (500 mg total) by mouth 4 (four) times daily. 01/10/14   Kaitlyn Szekalski, PA-C  ibuprofen (ADVIL,MOTRIN) 600  MG tablet Take 1 tablet (600 mg total) by mouth every 6 (six) hours. 09/04/12   Leona Singleton, MD  metroNIDAZOLE (FLAGYL) 500 MG tablet Take 1 tablet (500 mg total) by mouth 2 (two) times daily. 06/18/13   Rodolph Bong, MD  norgestimate-ethinyl estradiol (ORTHO-CYCLEN,SPRINTEC,PREVIFEM) 0.25-35 MG-MCG tablet Take 1 tablet by mouth daily. 06/18/13   Rodolph Bong, MD   BP 120/73 mmHg  Pulse 75  Temp(Src) 98.9 F (37.2 C) (Oral)  Resp 18  Ht  (1.549 m)  Wt 126 lb (57.153 kg)  BMI 23.82 kg/m2  SpO2 100%  LMP 03/30/2015 Physical Exam Physical Exam  Nursing note and vitals reviewed. Constitutional: She is oriented to person, place, and time. She appears well-developed and well-nourished. No distress.  HENT:  Head: Normocephalic and atraumatic.  Eyes: Pupils are equal, round, and reactive to light.  Neck: Normal range of motion.  Cardiovascular: Normal rate and intact distal pulses.   Pulmonary/Chest: No respiratory distress.  Abdominal: Normal appearance. She exhibits no distension.  Musculoskeletal: Normal range of motion.  Neurological: She is alert and oriented to person, place, and time. No cranial nerve deficit.  Skin: Skin is warm and dry. No rash noted.  Psychiatric: She has a normal mood and affect. Her behavior is  normal.   ED Course  Procedures (including critical care time) Labs Review Labs Reviewed  RAPID STREP SCREEN (NOT AT [facility])  CULTURE, GROUP A STREP [facility])      MDM   Final diagnoses:  Viral illness        Nelva Nay, MD 04/13/15 1451

## 2015-04-13 NOTE — Discharge Instructions (Signed)
Upper Respiratory Infection, Adult Most upper respiratory infections (URIs) are a viral infection of the air passages leading to the lungs. A URI affects the nose, throat, and upper air passages. The most common type of URI is nasopharyngitis and is typically referred to as "the common cold." URIs run their course and usually go away on their own. Most of the time, a URI does not require medical attention, but sometimes a bacterial infection in the upper airways can follow a viral infection. This is called a secondary infection. Sinus and middle ear infections are common types of secondary upper respiratory infections. Bacterial pneumonia can also complicate a URI. A URI can worsen asthma and chronic obstructive pulmonary disease (COPD). Sometimes, these complications can require emergency medical care and may be life threatening.  CAUSES Almost all URIs are caused by viruses. A virus is a type of germ and can spread from one person to another.  RISKS FACTORS You may be at risk for a URI if:   You smoke.   You have chronic heart or lung disease.  You have a weakened defense (immune) system.   You are very young or very old.   You have nasal allergies or asthma.  You work in crowded or poorly ventilated areas.  You work in health care facilities or schools. SIGNS AND SYMPTOMS  Symptoms typically develop 2-3 days after you come in contact with a cold virus. Most viral URIs last 7-10 days. However, viral URIs from the influenza virus (flu virus) can last 14-18 days and are typically more severe. Symptoms may include:   Runny or stuffy (congested) nose.   Sneezing.   Cough.   Sore throat.   Headache.   Fatigue.   Fever.   Loss of appetite.   Pain in your forehead, behind your eyes, and over your cheekbones (sinus pain).  Muscle aches.  DIAGNOSIS  Your health care provider may diagnose a URI by:  Physical exam.  Tests to check that your symptoms are not due to  another condition such as:  Strep throat.  Sinusitis.  Pneumonia.  Asthma. TREATMENT  A URI goes away on its own with time. It cannot be cured with medicines, but medicines may be prescribed or recommended to relieve symptoms. Medicines may help:  Reduce your fever.  Reduce your cough.  Relieve nasal congestion. HOME CARE INSTRUCTIONS   Take medicines only as directed by your health care provider.   Gargle warm saltwater or take cough drops to comfort your throat as directed by your health care provider.  Use a warm mist humidifier or inhale steam from a shower to increase air moisture. This may make it easier to breathe.  Drink enough fluid to keep your urine clear or pale yellow.   Eat soups and other clear broths and maintain good nutrition.   Rest as needed.   Return to work when your temperature has returned to normal or as your health care provider advises. You may need to stay home longer to avoid infecting others. You can also use a face mask and careful hand washing to prevent spread of the virus.  Increase the usage of your inhaler if you have asthma.   Do not use any tobacco products, including cigarettes, chewing tobacco, or electronic cigarettes. If you need help quitting, ask your health care provider. PREVENTION  The best way to protect yourself from getting a cold is to practice good hygiene.   Avoid oral or hand contact with people with cold   symptoms.   Wash your hands often if contact occurs.  There is no clear evidence that vitamin C, vitamin E, echinacea, or exercise reduces the chance of developing a cold. However, it is always recommended to get plenty of rest, exercise, and practice good nutrition.  SEEK MEDICAL CARE IF:   You are getting worse rather than better.   Your symptoms are not controlled by medicine.   You have chills.  You have worsening shortness of breath.  You have brown or red mucus.  You have yellow or brown nasal  discharge.  You have pain in your face, especially when you bend forward.  You have a fever.  You have swollen neck glands.  You have pain while swallowing.  You have white areas in the back of your throat. SEEK IMMEDIATE MEDICAL CARE IF:   You have severe or persistent:  Headache.  Ear pain.  Sinus pain.  Chest pain.  You have chronic lung disease and any of the following:  Wheezing.  Prolonged cough.  Coughing up blood.  A change in your usual mucus.  You have a stiff neck.  You have changes in your:  Vision.  Hearing.  Thinking.  Mood. MAKE SURE YOU:   Understand these instructions.  Will watch your condition.  Will get help right away if you are not doing well or get worse.   This information is not intended to replace advice given to you by your health care provider. Make sure you discuss any questions you have with your health care provider.   Document Released: 08/09/2000 Document Revised: 06/30/2014 Document Reviewed: 05/21/2013 Elsevier Interactive Patient Education 2016 [employer]  

## 2015-04-16 LAB — CULTURE, GROUP A STREP (THRC)

## 2015-09-27 ENCOUNTER — Emergency Department (HOSPITAL_BASED_OUTPATIENT_CLINIC_OR_DEPARTMENT_OTHER)
Admission: EM | Admit: 2015-09-27 | Discharge: 2015-09-27 | Disposition: A | Discharge status: Home / Self Care | Payer: No Typology Code available for payment source | Attending: Emergency Medicine | Admitting: Emergency Medicine

## 2015-09-27 ENCOUNTER — Encounter (HOSPITAL_BASED_OUTPATIENT_CLINIC_OR_DEPARTMENT_OTHER): Payer: Self-pay | Admitting: Emergency Medicine

## 2015-09-27 DIAGNOSIS — Z79899 Other long term (current) drug therapy: Secondary | ICD-10-CM | POA: Insufficient documentation

## 2015-09-27 DIAGNOSIS — R109 Unspecified abdominal pain: Secondary | ICD-10-CM

## 2015-09-27 DIAGNOSIS — F17210 Nicotine dependence, cigarettes, uncomplicated: Secondary | ICD-10-CM | POA: Insufficient documentation

## 2015-09-27 DIAGNOSIS — N12 Tubulo-interstitial nephritis, not specified as acute or chronic: Secondary | ICD-10-CM | POA: Insufficient documentation

## 2015-09-27 LAB — URINALYSIS, ROUTINE W REFLEX MICROSCOPIC
Bilirubin Urine: NEGATIVE
GLUCOSE, UA: NEGATIVE mg/dL
HGB URINE DIPSTICK: NEGATIVE
Ketones, ur: NEGATIVE mg/dL
Leukocytes, UA: NEGATIVE
Nitrite: POSITIVE — AB
Protein, ur: NEGATIVE mg/dL
SPECIFIC GRAVITY, URINE: 1.022 (ref 1.005–1.030)
pH: 7 (ref 5.0–8.0)

## 2015-09-27 LAB — URINE MICROSCOPIC-ADD ON

## 2015-09-27 LAB — PREGNANCY, URINE: Preg Test, Ur: NEGATIVE

## 2015-09-27 MED ORDER — SULFAMETHOXAZOLE-TRIMETHOPRIM 800-160 MG PO TABS: 1.0000 | ORAL_TABLET | Freq: Two times a day (BID) | ORAL | 0 refills | Status: AC

## 2015-09-27 NOTE — ED Notes (Signed)
Pt is c/o left mid back pain for the last two days.  She denies any dysuria, denies any injury.  Pt has tried tylenol and it has offered some relief.  Pt able to ambulate without apparent difficulty or distress.

## 2015-09-27 NOTE — ED Triage Notes (Signed)
Patient states that she was working about 2 days ago and started to have left flank pain. Reports some nausea

## 2015-09-27 NOTE — ED Provider Notes (Signed)
[facility] Provider Note   CSN: [id number] Arrival date & time: 09/27/15  2045  First Provider Contact:   First MD Initiated Contact with Patient 09/27/15 2123      By signing my name below, I, Soijett Blue, attest that this documentation has been prepared under the direction and in the presence of Fayrene Helper, PA-C Electronically Signed: Soijett Blue, ED Scribe. 09/27/15. 9:40 PM.   History   Chief Complaint Chief Complaint  Patient presents with  . Flank Pain    HPI Valerie Reyes is a 30 y.o. female who presents to the Emergency Department complaining of 7/10, stabbing, non-radiating, left flank pain onset 2 days. Pt notes that her left flank pain is worsened with movement and alleviated with laying on her right side. Denies any strenuous activity prior to the onset of her symptoms. Pt states that last year she had a kidney infection while she lived in [place], [place]. Pt states that last year she was diagnosed with a kidney infection after she experienced 2 days of LOC and treated in the ED. Pt is unsure of the medications that she was treated with at the time of her kidney infection. Pt denies having any urinary symptoms at that time. She states that she is having associated symptoms of nausea. She states that she has tried tylenol with no relief for her symptoms. She denies fever, vomiting, diarrhea, difficulty urinating, dysuria, vaginal bleeding, vaginal discharge, hematuria, CP, SOB, and any other symptoms. Pt denies being pregnant at this time due to recently taking a home pregnancy test.    The history is provided by the patient. No language interpreter was used.    Past Medical History:  Diagnosis Date  . Bronchitis   . Medical history non-contributory   . Pregnancy     Patient Active Problem List   Diagnosis Date Noted  . Late prenatal care 04/23/2012    Past Surgical History:  Procedure Laterality Date  . CESAREAN SECTION N/A 09/01/2012   Procedure:  CESAREAN SECTION;  Surgeon: Lesly Dukes, MD;  Location: [facility];  Service: Obstetrics;  Laterality: N/A;  . NO PAST SURGERIES      OB History    Gravida Para Term Preterm AB Living   1 1 1  0 0 1   SAB TAB Ectopic Multiple Live Births   0 0 0 0         Home Medications    Prior to Admission medications   Medication Sig Start Date End Date Taking? Authorizing Provider  azithromycin (ZITHROMAX) 250 MG tablet Take 4 tablets (1,000 mg total) by mouth once. 06/18/13   Rodolph Bong, MD  cephALEXin (KEFLEX) 500 MG capsule Take 1 capsule (500 mg total) by mouth 4 (four) times daily. 01/10/14   Kaitlyn Szekalski, PA-C  ibuprofen (ADVIL,MOTRIN) 600 MG tablet Take 1 tablet (600 mg total) by mouth every 6 (six) hours. 09/04/12   Leona Singleton, MD  metroNIDAZOLE (FLAGYL) 500 MG tablet Take 1 tablet (500 mg total) by mouth 2 (two) times daily. 06/18/13   Rodolph Bong, MD  norgestimate-ethinyl estradiol (ORTHO-CYCLEN,SPRINTEC,PREVIFEM) 0.25-35 MG-MCG tablet Take 1 tablet by mouth daily. 06/18/13   Rodolph Bong, MD    Family History Family History  Problem Relation Age of Onset  . Cancer Mother   . Diabetes Mother   . Hypertension Mother   . Cancer Maternal Aunt     lung  . Diabetes Maternal Grandmother   . Stroke Maternal Grandmother   .  Hypertension Maternal Grandmother   . Hypertension Paternal Grandmother   . Diabetes Paternal Grandmother     Social History Social History  Substance Use Topics  . Smoking status: Current Every Day Smoker    Packs/day: 1.00    Types: Cigarettes    Last attempt to quit: 04/09/2012  . Smokeless tobacco: Never Used  . Alcohol use No     Allergies   Review of patient's allergies indicates no known allergies.   Review of Systems Review of Systems  Constitutional: Negative for fever.  Respiratory: Negative for shortness of breath.   Cardiovascular: Negative for chest pain.  Gastrointestinal: Positive for nausea. Negative for abdominal pain,  diarrhea and vomiting.  Genitourinary: Positive for flank pain (left). Negative for difficulty urinating, dysuria, hematuria, vaginal bleeding and vaginal discharge.     Physical Exam Updated Vital Signs BP 127/90 (BP Location: Right Arm)   Pulse 76   Temp 98.7 F (37.1 C) (Oral)   Resp 20   Ht 5\' 1"  (1.549 m)   Wt 130 lb (59 kg)   LMP 08/14/2015 (Approximate)   SpO2 100%   BMI 24.56 kg/m   Physical Exam  Constitutional: She is oriented to person, place, and time. She appears well-developed and well-nourished. No distress.  HENT:  Head: Normocephalic and atraumatic.  Eyes: EOM are normal.  Neck: Neck supple.  Cardiovascular: Normal rate, regular rhythm and normal heart sounds.  Exam reveals no gallop and no friction rub.   No murmur heard. Pulmonary/Chest: Effort normal and breath sounds normal. No respiratory distress. She has no wheezes. She has no rales.  Abdominal: Soft. She exhibits no distension. There is no tenderness. There is no CVA tenderness.  No CVA tenderness  Musculoskeletal: Normal range of motion. She exhibits no tenderness.       Cervical back: Normal.       Thoracic back: Normal.       Lumbar back: Normal.  No significant midline spinal tenderness, crepitus, or step-offs.   Lymphadenopathy:    She has no cervical adenopathy.  Neurological: She is alert and oriented to person, place, and time.  Skin: Skin is warm and dry.  Psychiatric: She has a normal mood and affect. Her behavior is normal.  Nursing note and vitals reviewed.    ED Treatments / Results  DIAGNOSTIC STUDIES: Oxygen Saturation is 100% on RA, nl by my interpretation.    COORDINATION OF CARE: 9:27 PM Discussed treatment plan with pt at bedside which includes UA and pt agreed to plan.   Labs (all labs ordered are listed, but only abnormal results are displayed) Labs Reviewed  URINALYSIS, ROUTINE W REFLEX MICROSCOPIC (NOT AT [facility]) - Abnormal; Notable for the following:       Result  Value   APPearance CLOUDY (*)    Nitrite POSITIVE (*)    All other components within normal limits  URINE MICROSCOPIC-ADD ON - Abnormal; Notable for the following:    Squamous Epithelial / LPF 0-5 (*)    Bacteria, UA MANY (*)    All other components within normal limits  PREGNANCY, URINE    EKG  EKG Interpretation None       Radiology No results found.  Procedures Procedures (including critical care time)  Medications Ordered in ED Medications - No data to display   Initial Impression / Assessment and Plan / ED Course  I have reviewed the triage vital signs and the nursing notes.  Pertinent labs & imaging results that were available during  my care of the patient were reviewed by me and considered in my medical decision making (see chart for details).  Clinical Course    BP 127/90 (BP Location: Right Arm)   Pulse 76   Temp 98.7 F (37.1 C) (Oral)   Resp 20   Ht  (1.549 m)   Wt 59 kg   LMP 08/14/2015 (Approximate)   SpO2 100%   BMI 24.56 kg/m    Final Clinical Impressions(s) / ED Diagnoses   Final diagnoses:  Left flank pain  Pyelonephritis    New Prescriptions New Prescriptions   SULFAMETHOXAZOLE-TRIMETHOPRIM (BACTRIM DS,SEPTRA DS) 800-160 MG TABLET    Take 1 tablet by mouth 2 (two) times daily.    I personally performed the services described in this documentation, which was scribed in my presence. The recorded information has been reviewed and is accurate.     9:44 PM Pt here with L flank pain, similar to prior kidney infection.  UA with finding concerning for UTI.  She is well appearing, and her presentation does not suggest kidney stone.  No blood in her urine.  Furthermore no abd pain or pelvic pain, vaginal bleeding or vaginal discharge.      Fayrene Helper, PA-C 09/27/15 2212    Rolland Porter, MD 10/09/15 [phone]

## 2015-09-27 NOTE — ED Notes (Signed)
Provider at bedside

## 2015-09-27 NOTE — ED Notes (Signed)
Pt verbalizes understanding of d/c instructions and denies any further need at this time. 

## 2015-10-03 ENCOUNTER — Emergency Department (HOSPITAL_BASED_OUTPATIENT_CLINIC_OR_DEPARTMENT_OTHER): Payer: Medicaid Other

## 2015-10-03 ENCOUNTER — Encounter (HOSPITAL_BASED_OUTPATIENT_CLINIC_OR_DEPARTMENT_OTHER): Payer: Self-pay | Admitting: *Deleted

## 2015-10-03 ENCOUNTER — Emergency Department (HOSPITAL_BASED_OUTPATIENT_CLINIC_OR_DEPARTMENT_OTHER)
Admission: EM | Admit: 2015-10-03 | Discharge: 2015-10-03 | Disposition: A | Discharge status: Home / Self Care | Payer: Medicaid Other | Attending: Emergency Medicine | Admitting: Emergency Medicine

## 2015-10-03 DIAGNOSIS — F17210 Nicotine dependence, cigarettes, uncomplicated: Secondary | ICD-10-CM | POA: Insufficient documentation

## 2015-10-03 DIAGNOSIS — Z79899 Other long term (current) drug therapy: Secondary | ICD-10-CM | POA: Diagnosis not present

## 2015-10-03 DIAGNOSIS — N83202 Unspecified ovarian cyst, left side: Secondary | ICD-10-CM | POA: Insufficient documentation

## 2015-10-03 DIAGNOSIS — R102 Pelvic and perineal pain: Secondary | ICD-10-CM

## 2015-10-03 DIAGNOSIS — R1030 Lower abdominal pain, unspecified: Secondary | ICD-10-CM | POA: Diagnosis present

## 2015-10-03 HISTORY — DX: Personal history of urinary (tract) infections: Z87.440

## 2015-10-03 LAB — COMPREHENSIVE METABOLIC PANEL
ALBUMIN: 4.3 g/dL (ref 3.5–5.0)
ALK PHOS: 58 U/L (ref 38–126)
ALT: 18 U/L (ref 14–54)
ANION GAP: 4 — AB (ref 5–15)
AST: 22 U/L (ref 15–41)
BUN: 13 mg/dL (ref 6–20)
CO2: 26 mmol/L (ref 22–32)
Calcium: 8.9 mg/dL (ref 8.9–10.3)
Chloride: 103 mmol/L (ref 101–111)
Creatinine, Ser: 0.74 mg/dL (ref 0.44–1.00)
GFR calc Af Amer: >60 mL/min (ref >60)
GLUCOSE: 96 mg/dL (ref 65–99)
Potassium: 3.8 mmol/L (ref 3.5–5.1)
Sodium: 133 mmol/L — ABNORMAL LOW (ref 135–145)
Total Bilirubin: 0.8 mg/dL (ref 0.3–1.2)
Total Protein: 7.3 g/dL (ref 6.5–8.1)

## 2015-10-03 LAB — CBC WITH DIFFERENTIAL/PLATELET
BASOS PCT: 0 %
Basophils Absolute: 0 10*3/uL (ref 0.0–0.1)
Eosinophils Absolute: 0.1 10*3/uL (ref 0.0–0.7)
Eosinophils Relative: 1 %
HEMATOCRIT: 42.6 % (ref 36.0–46.0)
HEMOGLOBIN: 14.3 g/dL (ref 12.0–15.0)
LYMPHS ABS: 2 10*3/uL (ref 0.7–4.0)
Lymphocytes Relative: 29 %
MCH: 29.9 pg (ref 26.0–34.0)
MCHC: 33.6 g/dL (ref 30.0–36.0)
MCV: 88.9 fL (ref 78.0–100.0)
MONO ABS: 0.5 10*3/uL (ref 0.1–1.0)
MONOS PCT: 7 %
NEUTROS ABS: 4.4 10*3/uL (ref 1.7–7.7)
NEUTROS PCT: 63 %
Platelets: 175 10*3/uL (ref 150–400)
RBC: 4.79 MIL/uL (ref 3.87–5.11)
RDW: 13.1 % (ref 11.5–15.5)
WBC: 6.9 10*3/uL (ref 4.0–10.5)

## 2015-10-03 LAB — URINALYSIS, ROUTINE W REFLEX MICROSCOPIC
Bilirubin Urine: NEGATIVE
GLUCOSE, UA: NEGATIVE mg/dL
HGB URINE DIPSTICK: NEGATIVE
Ketones, ur: 15 mg/dL — AB
LEUKOCYTES UA: NEGATIVE
Nitrite: NEGATIVE
PH: 7.5 (ref 5.0–8.0)
Protein, ur: NEGATIVE mg/dL
SPECIFIC GRAVITY, URINE: 1.025 (ref 1.005–1.030)

## 2015-10-03 LAB — WET PREP, GENITAL
Sperm: NONE SEEN
Trich, Wet Prep: NONE SEEN
Yeast Wet Prep HPF POC: NONE SEEN

## 2015-10-03 LAB — PREGNANCY, URINE: Preg Test, Ur: NEGATIVE

## 2015-10-03 MED ORDER — ONDANSETRON HCL 4 MG/2ML IJ SOLN
4.0000 mg | Freq: Once | INTRAMUSCULAR | Status: AC
  Administered 2015-10-03: 4 mg via INTRAVENOUS
  Filled 2015-10-03: qty 2

## 2015-10-03 MED ORDER — METRONIDAZOLE 500 MG PO TABS
500.0000 mg | ORAL_TABLET | Freq: Two times a day (BID) | ORAL | 0 refills | Status: DC
Start: 2015-10-03 — End: 2015-12-20

## 2015-10-03 MED ORDER — MORPHINE SULFATE (PF) 4 MG/ML IV SOLN
4.0000 mg | Freq: Once | INTRAVENOUS | Status: AC
  Administered 2015-10-03: 4 mg via INTRAVENOUS
  Filled 2015-10-03: qty 1

## 2015-10-03 MED ORDER — HYDROCODONE-ACETAMINOPHEN 5-325 MG PO TABS: 1.0000 | ORAL_TABLET | Freq: Four times a day (QID) | ORAL | 0 refills | Status: DC | PRN

## 2015-10-03 NOTE — ED Notes (Signed)
Patient transported to Ultrasound 

## 2015-10-03 NOTE — ED Notes (Signed)
Pt reports sudden onset of RLQ pain a few hours ago, describes it as feeling like contractions. Also reports vaginal discharge follow recent course of abx. Pt denies N/V

## 2015-10-03 NOTE — ED Notes (Signed)
Pt able to walk with no assist, seemingly with no effort.

## 2015-10-03 NOTE — ED Provider Notes (Signed)
[facility]   CSN: [id number] Arrival date & time: 10/03/15  1657  First Provider Contact:  First MD Initiated Contact with Patient 10/03/15 1825        By signing my name below, I, Doreatha Martin, attest that this documentation has been prepared under the direction and in the presence of  Alette Kataoka, PA-C. Electronically Signed: Doreatha Martin, ED Scribe. 10/03/15. 6:32 PM.    History   Chief Complaint Chief Complaint  Patient presents with  . Abdominal Pain    HPI Valerie Reyes is a 30 y.o. female who presents to the Emergency Department complaining of moderate, sudden onset, constant, cramping lower abdominal pain onset one hour ago. Pt also complains of bilateral flank pain, white vaginal discharge and nausea at this time. Pt states her vaginal discharge has been present for 2 days.  She states her pain is worsened with urination. Pt denies taking OTC medications at home to improve symptoms. No h/o of similar symptoms. Pt was prescribed Bactrim on 09/27/15 after she was diagnosed with UTI in the ED and notes those symptoms resolved after this treatment. She reports her current symptoms are not similar to this recent dx of UTI. She has no concern for pregnancy. She denies fever, chills, emesis, diarrhea, dysuria, frequency, urgency, back pain, additional complaints.    The history is provided by the patient. No language interpreter was used.    Past Medical History:  Diagnosis Date  . Bronchitis   . History of kidney infection   . Medical history non-contributory   . Pregnancy     Patient Active Problem List   Diagnosis Date Noted  . Late prenatal care 04/23/2012    Past Surgical History:  Procedure Laterality Date  . CESAREAN SECTION N/A 09/01/2012   Procedure: CESAREAN SECTION;  Surgeon: Lesly Dukes, MD;  Location: [facility];  Service: Obstetrics;  Laterality: N/A;  . NO PAST SURGERIES      OB History    Gravida Para Term Preterm AB  Living   0 0 1   SAB TAB Ectopic Multiple Live Births   0 0 0 0 1       Home Medications    Prior to Admission medications   Medication Sig Start Date End Date Taking? Authorizing Provider  sulfamethoxazole-trimethoprim (BACTRIM DS,SEPTRA DS) 800-160 MG tablet Take 1 tablet by mouth 2 (two) times daily. 09/27/15 10/04/15 Yes Fayrene Helper, PA-C  azithromycin (ZITHROMAX) 250 MG tablet Take 4 tablets (1,000 mg total) by mouth once. 06/18/13   Rodolph Bong, MD  cephALEXin (KEFLEX) 500 MG capsule Take 1 capsule (500 mg total) by mouth 4 (four) times daily. 01/10/14   Kaitlyn Szekalski, PA-C  ibuprofen (ADVIL,MOTRIN) 600 MG tablet Take 1 tablet (600 mg total) by mouth every 6 (six) hours. 09/04/12   Leona Singleton, MD  metroNIDAZOLE (FLAGYL) 500 MG tablet Take 1 tablet (500 mg total) by mouth 2 (two) times daily. 06/18/13   Rodolph Bong, MD  norgestimate-ethinyl estradiol (ORTHO-CYCLEN,SPRINTEC,PREVIFEM) 0.25-35 MG-MCG tablet Take 1 tablet by mouth daily. 06/18/13   Rodolph Bong, MD    Family History Family History  Problem Relation Age of Onset  . Cancer Mother   . Diabetes Mother   . Hypertension Mother   . Cancer Maternal Aunt     lung  . Diabetes Maternal Grandmother   . Stroke Maternal Grandmother   . Hypertension Maternal Grandmother   . Hypertension Paternal Grandmother   .  Diabetes Paternal Grandmother     Social History Social History  Substance Use Topics  . Smoking status: Current Every Day Smoker    Packs/day: 1.00    Types: Cigarettes    Last attempt to quit: 04/09/2012  . Smokeless tobacco: Never Used  . Alcohol use No     Allergies   Review of patient's allergies indicates no known allergies.   Review of Systems Review of Systems  Constitutional: Negative for chills and fever.  Gastrointestinal: Positive for abdominal pain and nausea. Negative for diarrhea and vomiting.  Genitourinary: Positive for flank pain and vaginal discharge. Negative for  dysuria, frequency and urgency.  Musculoskeletal: Negative for back pain.     Physical Exam Updated Vital Signs BP 113/74 (BP Location: Right Arm)   Pulse 68   Temp 99.8 F (37.7 C) (Oral)   Resp 18   Ht 5\' 1"  (1.549 m)   Wt 134 lb (60.8 kg)   LMP 08/14/2015 (Approximate)   SpO2 100%   BMI 25.32 kg/m   Physical Exam  Constitutional: She appears well-developed and well-nourished.  HENT:  Head: Normocephalic.  Eyes: Conjunctivae are normal.  Cardiovascular: Normal rate, regular rhythm and normal heart sounds.   No murmur heard. Pulmonary/Chest: Effort normal and breath sounds normal. No respiratory distress. She has no wheezes. She has no rales.  Lungs CTA bilaterally.   Abdominal: Soft. She exhibits no distension. There is tenderness. There is no rebound and no guarding.  TTP across the lower abdomen. No rebound or guarding.   Genitourinary: Vagina normal. Uterus is not tender. Cervix exhibits no motion tenderness. Right adnexum displays no tenderness and no fullness. Left adnexum displays tenderness. Left adnexum displays no fullness.  Genitourinary Comments: Left adnexal TTP. Chaperone present throughout entire exam.    Musculoskeletal: Normal range of motion.  Neurological: She is alert.  Skin: Skin is warm and dry.  Psychiatric: She has a normal mood and affect. Her behavior is normal.  Nursing note and vitals reviewed.    ED Treatments / Results  Labs (all labs ordered are listed, but only abnormal results are displayed) Labs Reviewed  URINALYSIS, ROUTINE W REFLEX MICROSCOPIC (NOT AT [facility])  PREGNANCY, URINE    EKG  EKG Interpretation None       Radiology No results found.  Procedures Procedures (including critical care time)  DIAGNOSTIC STUDIES: Oxygen Saturation is 100% on RA, normal by my interpretation.    COORDINATION OF CARE: 6:31 PM Discussed treatment plan with pt at bedside which includes UA, lab work and pt agreed to plan.     Medications Ordered in ED Medications - No data to display   Initial Impression / Assessment and Plan / ED Course  I have reviewed the triage vital signs and the nursing notes.  Pertinent labs & imaging results that were available during my care of the patient were reviewed by me and considered in my medical decision making (see chart for details).  Clinical Course    Hadiyah Modisette presents to the ED for evaluation of LLQ pain and vaginal discharge.  Ultrasound showing left ovarian cyst.  No CMT on exam.  Wet prep showing clue cells.  GC/Chlamydia pending.  Patient will be sent home with Flagyl.  Patient advised to follow up with OB/GYN.  Patient appears stable for discharge at this time. Return precautions discussed and outlined in discharge paperwork. Patient is agreeable to plan.     Final Clinical Impressions(s) / ED Diagnoses   Final diagnoses:  None    New Prescriptions New Prescriptions   No medications on file    I personally performed the services described in this documentation, which was scribed in my presence. The recorded information has been reviewed and is accurate.    Santiago GladHeather Kennede Lusk, PA-C 10/06/15 2233    Lavera Guiseana Duo Liu, MD 10/07/15 [phone]

## 2015-10-03 NOTE — Discharge Instructions (Signed)
Follow up with OB/GYN in 6-12 weeks for repeat ultrasound preferably the week after your period.

## 2015-10-03 NOTE — ED Triage Notes (Signed)
Patient states she developed lower abdominal pain about one hour pta.  Describes the pain as cramping pain and is associated with nausea.  Was seen here 6 days ago for flank pain.  States she has thick white vaginal discharge.  Denies urinary problems.

## 2015-10-04 LAB — GC/CHLAMYDIA PROBE AMP (~~LOC~~) NOT AT ARMC
Chlamydia: NEGATIVE
NEISSERIA GONORRHEA: NEGATIVE

## 2015-12-20 ENCOUNTER — Encounter (HOSPITAL_BASED_OUTPATIENT_CLINIC_OR_DEPARTMENT_OTHER): Payer: Self-pay | Admitting: Emergency Medicine

## 2015-12-20 ENCOUNTER — Emergency Department (HOSPITAL_BASED_OUTPATIENT_CLINIC_OR_DEPARTMENT_OTHER)
Admission: EM | Admit: 2015-12-20 | Discharge: 2015-12-20 | Disposition: A | Discharge status: Home / Self Care | Payer: Medicaid Other | Attending: Physician Assistant | Admitting: Physician Assistant

## 2015-12-20 DIAGNOSIS — F17210 Nicotine dependence, cigarettes, uncomplicated: Secondary | ICD-10-CM | POA: Insufficient documentation

## 2015-12-20 DIAGNOSIS — Z79899 Other long term (current) drug therapy: Secondary | ICD-10-CM | POA: Insufficient documentation

## 2015-12-20 DIAGNOSIS — N898 Other specified noninflammatory disorders of vagina: Secondary | ICD-10-CM

## 2015-12-20 DIAGNOSIS — R102 Pelvic and perineal pain: Secondary | ICD-10-CM | POA: Diagnosis present

## 2015-12-20 HISTORY — DX: Trichomoniasis, unspecified: A59.9

## 2015-12-20 LAB — URINALYSIS, ROUTINE W REFLEX MICROSCOPIC
Bilirubin Urine: NEGATIVE
Glucose, UA: NEGATIVE mg/dL
HGB URINE DIPSTICK: NEGATIVE
Ketones, ur: NEGATIVE mg/dL
LEUKOCYTES UA: NEGATIVE
NITRITE: NEGATIVE
Protein, ur: NEGATIVE mg/dL
SPECIFIC GRAVITY, URINE: 1.018 (ref 1.005–1.030)
pH: 6.5 (ref 5.0–8.0)

## 2015-12-20 LAB — WET PREP, GENITAL
Sperm: NONE SEEN
TRICH WET PREP: NONE SEEN
YEAST WET PREP: NONE SEEN

## 2015-12-20 LAB — PREGNANCY, URINE: PREG TEST UR: NEGATIVE

## 2015-12-20 MED ORDER — AZITHROMYCIN 250 MG PO TABS
1000.0000 mg | ORAL_TABLET | Freq: Once | ORAL | Status: AC
  Administered 2015-12-20: 1000 mg via ORAL
  Filled 2015-12-20: qty 4

## 2015-12-20 MED ORDER — CEFTRIAXONE SODIUM 250 MG IJ SOLR
250.0000 mg | Freq: Once | INTRAMUSCULAR | Status: AC
  Administered 2015-12-20: 250 mg via INTRAMUSCULAR
  Filled 2015-12-20: qty 250

## 2015-12-20 NOTE — ED Triage Notes (Signed)
Pt states she has pelvic pain, with vaginal discharge and odor x 2 weeks.

## 2015-12-20 NOTE — Discharge Instructions (Signed)
Please read and follow all provided instructions.  Your diagnoses today include:  1. Vaginal discharge     Tests performed today include:  Test for gonorrhea and chlamydia.   Urine test - no urine infection  Wet prep - no trichomonas, yeast infection  Test for HIV and syphilis.   You will be notified by telephone with any positive results.  Vital signs. See below for your results today.   Medications:  You were treated with azithromycin and rocephin today. These antibiotics treat you for gonorrhea and chlamydia. They do not treat for HIV or syphilis.   Home care instructions:  Read educational materials contained in this packet and follow any instructions provided.   You should tell your partners about your infection and avoid having sex for one week to allow time for the medicine to work.  Sexually transmitted disease testing also available at:   [facility] [place]  [address], [place], phone [phone] or [phone]    Monday - Friday, call for an appointment  [facility] [place]  [address], [place], phone [phone] or [phone]   Monday - Friday, call for an appointment  Return instructions:   Please return to the Emergency Department if you experience worsening symptoms.   Please return if you have any other emergent concerns.  Additional Information:  Your vital signs today were: BP 114/67 (BP Location: Right Arm)    Pulse 62    Temp 98.2 F (36.8 C) (Oral)    Resp 18    Ht 5\' 2"  (1.575 m)    Wt 58.1 kg    LMP 10/14/2015    SpO2 100%    BMI 23.41 kg/m  If your blood pressure (BP) was elevated above 135/85 this visit, please have this repeated by your doctor within one month. --------------

## 2015-12-20 NOTE — ED Provider Notes (Signed)
[facility]   CSN: [id number] Arrival date & time: 12/20/15  1150     History   Chief Complaint Chief Complaint  Patient presents with  . Vaginal Discharge    HPI Ta Valerie Reyes is a 30 y.o. female.  Patient presents with complaint of vaginal discharge, pelvic pain, odor ongoing for the past 2 weeks. Patient was seen by the health department and states that she performed a swab on herself which they tested. They told her that she had no infections. Patient is concerned about a sexual transmitted infection. She has not had any fevers, nausea, vomiting, diarrhea. No urinary symptoms. 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 No lower abdominal surgeries. Onset of symptoms acute. Course is constant. Nothing makes symptoms better or worse.      Past Medical History:  Diagnosis Date  . Bronchitis   . History of kidney infection   . Medical history non-contributory   . Pregnancy   . Trichomonosis     Patient Active Problem List   Diagnosis Date Noted  . Late prenatal care 04/23/2012    Past Surgical History:  Procedure Laterality Date  . CESAREAN SECTION N/A 09/01/2012   Procedure: CESAREAN SECTION;  Surgeon: Lesly Dukes, MD;  Location: [facility];  Service: Obstetrics;  Laterality: N/A;  . NO PAST SURGERIES      OB History    Gravida Para Term Preterm AB Living   1 1 1  0 0 1   SAB TAB Ectopic Multiple Live Births   0 0 0 0 1       Home Medications    Prior to Admission medications   Medication Sig Start Date End Date Taking? Authorizing Provider  azithromycin (ZITHROMAX) 250 MG tablet Take 4 tablets (1,000 mg total) by mouth once. 06/18/13   Rodolph Bong, MD    Family History Family History  Problem Relation Age of Onset  . Cancer Mother   . Diabetes Mother   . Hypertension Mother   . Cancer Maternal Aunt     lung  . Diabetes Maternal Grandmother   . Stroke Maternal Grandmother   . Hypertension Maternal  Grandmother   . Hypertension Paternal Grandmother   . Diabetes Paternal Grandmother     Social History Social History  Substance Use Topics  . Smoking status: Current Every Day Smoker    Packs/day: 1.00    Types: Cigarettes    Last attempt to quit: 04/09/2012  . Smokeless tobacco: Never Used  . Alcohol use No     Allergies   Review of patient's allergies indicates no known allergies.   Review of Systems Review of Systems  Constitutional: Negative for fever.  HENT: Negative for sore throat.   Eyes: Negative for discharge.  Gastrointestinal: Negative for rectal pain.  Genitourinary: Positive for pelvic pain and vaginal discharge. Negative for dysuria, frequency, genital sores and vaginal bleeding.  Musculoskeletal: Negative for arthralgias.  Skin: Negative for rash.  Hematological: Negative for adenopathy.     Physical Exam Updated Vital Signs BP 114/77   Pulse 75   Temp 98.2 F (36.8 C) (Oral)   Resp 16   Ht 5\' 2"  (1.575 m)   Wt 58.1 kg   LMP 10/14/2015   SpO2 100%   BMI 23.41 kg/m   Physical Exam  Constitutional: She appears well-developed and well-nourished.  HENT:  Head: Normocephalic and atraumatic.  Mouth/Throat: Oropharynx is clear and moist.  Eyes: Conjunctivae are normal. Right eye exhibits  no discharge. Left eye exhibits no discharge.  Neck: Normal range of motion. Neck supple.  Cardiovascular: Normal rate, regular rhythm and normal heart sounds.   Pulmonary/Chest: Effort normal and breath sounds normal.  Abdominal: Soft. There is no tenderness.  Genitourinary: Pelvic exam was performed with patient supine. Uterus is not tender. Cervix exhibits no motion tenderness, no discharge and no friability. Right adnexum displays no mass and no tenderness. Left adnexum displays no mass and no tenderness. Vaginal discharge (Scant, white, non-homogenous) found.  Neurological: She is alert.  Skin: Skin is warm and dry.  Psychiatric: She has a normal mood and  affect.  Nursing note and vitals reviewed.    ED Treatments / Results  Labs (all labs ordered are listed, but only abnormal results are displayed) Labs Reviewed  WET PREP, GENITAL - Abnormal; Notable for the following:       Result Value   Clue Cells Wet Prep HPF POC PRESENT (*)    WBC, Wet Prep HPF POC MANY (*)    All other components within normal limits  URINE CULTURE  PREGNANCY, URINE  URINALYSIS, ROUTINE W REFLEX MICROSCOPIC (NOT AT [facility])  RPR  HIV ANTIBODY (ROUTINE TESTING)  GC/CHLAMYDIA PROBE AMP ([facility]) NOT AT [facility]    Procedures Procedures (including critical care time)  Medications Ordered in ED Medications  cefTRIAXone (ROCEPHIN) injection 250 mg (250 mg Intramuscular Given 12/20/15 1500)  azithromycin (ZITHROMAX) tablet 1,000 mg (1,000 mg Oral Given 12/20/15 1500)     Initial Impression / Assessment and Plan / ED Course  I have reviewed the triage vital signs and the nursing notes.  Pertinent labs & imaging results that were available during my care of the patient were reviewed by me and considered in my medical decision making (see chart for details).  Clinical Course   Patient seen and examined.  Vital signs reviewed and are as follows: BP 114/67 (BP Location: Right Arm)   Pulse 62   Temp 98.2 F (36.8 C) (Oral)   Resp 18   Ht 5\' 2"  (1.575 m)   Wt 58.1 kg   LMP 10/14/2015   SpO2 100%   BMI 23.41 kg/m   Pelvic exam performed with NT chaperone Victorino Dike(Jennifer).   Will test and treat for STD exposure. Patient offered HIV and syphilis testing. Patient counseled on safe sexual practices. Told them that they should not have sexual contact for next 7 days and that they need to inform sexual partners so that they can get tested and treated as well. Patient verbalizes understanding and agrees with plan.     Final Clinical Impressions(s) / ED Diagnoses   Final diagnoses:  Vaginal discharge   Patient with vaginal discharge. No clinical signs of PID.  Patient has a slight amount of discharge on exam. She is concerned that she may have STI. Patient tested and treated as above. No abd tenderness on exam. No UTI.   New Prescriptions New Prescriptions   No medications on file     Renne CriglerJoshua Kadynce Bonds, PA-C 12/20/15 1522    Courteney Lyn Corlis LeakMackuen, MD 12/29/15 1507

## 2015-12-21 LAB — GC/CHLAMYDIA PROBE AMP (~~LOC~~) NOT AT ARMC
Chlamydia: NEGATIVE
Neisseria Gonorrhea: NEGATIVE

## 2015-12-21 LAB — HIV ANTIBODY (ROUTINE TESTING W REFLEX): HIV Screen 4th Generation wRfx: NONREACTIVE

## 2015-12-21 LAB — RPR: RPR: NONREACTIVE

## 2015-12-22 LAB — URINE CULTURE: Culture: >=100000 — AB

## 2015-12-23 ENCOUNTER — Encounter: Payer: Self-pay | Admitting: Obstetrics & Gynecology

## 2015-12-23 ENCOUNTER — Telehealth (HOSPITAL_BASED_OUTPATIENT_CLINIC_OR_DEPARTMENT_OTHER): Payer: Self-pay | Admitting: *Deleted

## 2015-12-23 NOTE — Telephone Encounter (Signed)
Post ED Visit - Positive Culture Follow-up  Culture report reviewed by antimicrobial stewardship pharmacist:  []  Enzo BiNathan Batchelder, Pharm.D. []  Celedonio MiyamotoJeremy Frens, Pharm.D., BCPS []  Garvin FilaMike Maccia, Pharm.D. []  Georgina PillionElizabeth Martin, Pharm.D., BCPS []  PerkinsMinh Pham, [address]Pharm[address]D., BCPS, AAHIVP []  Estella HuskMichelle Turner, Pharm.D., BCPS, AAHIVP []  Tennis Mustassie Stewart, Pharm.D. []  Sherle Poeob Vincent, [address]Pharm[address]  Positive urine culture Treated with antibiotics in ED and no further patient follow-up is required at this time per Jaynie Crumbleatyana Kirichenko, PA-C.  Virl AxeRobertson, Rhondalyn Clingan [facility] 12/23/2015, 9:34 AM

## 2016-04-25 ENCOUNTER — Encounter (INDEPENDENT_AMBULATORY_CARE_PROVIDER_SITE_OTHER): Payer: Self-pay | Admitting: Physician Assistant

## 2016-04-25 ENCOUNTER — Ambulatory Visit (INDEPENDENT_AMBULATORY_CARE_PROVIDER_SITE_OTHER): Payer: Medicaid Other | Admitting: Physician Assistant

## 2016-04-25 VITALS — BP 93/57 | HR 89 | Temp 98.1°F | Ht 62.5 in | Wt 128.4 lb

## 2016-04-25 DIAGNOSIS — N390 Urinary tract infection, site not specified: Secondary | ICD-10-CM

## 2016-04-25 DIAGNOSIS — R319 Hematuria, unspecified: Secondary | ICD-10-CM

## 2016-04-25 DIAGNOSIS — R109 Unspecified abdominal pain: Secondary | ICD-10-CM | POA: Diagnosis not present

## 2016-04-25 DIAGNOSIS — K0889 Other specified disorders of teeth and supporting structures: Secondary | ICD-10-CM

## 2016-04-25 LAB — POCT URINALYSIS DIPSTICK
GLUCOSE UA: NEGATIVE
Ketones, UA: NEGATIVE
Leukocytes, UA: NEGATIVE
NITRITE UA: POSITIVE
PROTEIN UA: NEGATIVE
SPEC GRAV UA: 1.02
UROBILINOGEN UA: 0.2
pH, UA: 6

## 2016-04-25 MED ORDER — ACETAMINOPHEN 500 MG PO TABS: 1000.0000 mg | ORAL_TABLET | Freq: Three times a day (TID) | ORAL | 0 refills | Status: AC | PRN

## 2016-04-25 MED ORDER — AMOXICILLIN-POT CLAVULANATE 875-125 MG PO TABS: 1.0000 | ORAL_TABLET | Freq: Two times a day (BID) | ORAL | 0 refills | Status: AC

## 2016-04-25 NOTE — Patient Instructions (Addendum)
Urine culture results should be reported in 3-5 days. We will call you if we need to change your antibiotic. Please monitor your symptoms and go to the ED or call us [phone]) should your symptoms persist or worsen.    Urinary Tract Infection, Adult A urinary tract infection (UTI) is an infection of any part of the urinary tract, which includes the kidneys, ureters, bladder, and urethra. These organs make, store, and get rid of urine in the body. UTI can be a bladder infection (cystitis) or kidney infection (pyelonephritis). What are the causes? This infection may be caused by fungi, viruses, or bacteria. Bacteria are the most common cause of UTIs. This condition can also be caused by repeated incomplete emptying of the bladder during urination. What increases the risk? This condition is more likely to develop if:  You ignore your need to urinate or hold urine for long periods of time.  You do not empty your bladder completely during urination.  You wipe back to front after urinating or having a bowel movement, if you are female.  You are uncircumcised, if you are female.  You are constipated.  You have a urinary catheter that stays in place (indwelling).  You have a weak defense (immune) system.  You have a medical condition that affects your bowels, kidneys, or bladder.  You have diabetes.  You take antibiotic medicines frequently or for long periods of time, and the antibiotics no longer work well against certain types of infections (antibiotic resistance).  You take medicines that irritate your urinary tract.  You are exposed to chemicals that irritate your urinary tract.  You are female. What are the signs or symptoms? Symptoms of this condition include:  Fever.  Frequent urination or passing small amounts of urine frequently.  Needing to urinate urgently.  Pain or burning with urination.  Urine that smells bad or unusual.  Cloudy urine.  Pain in the lower  abdomen or back.  Trouble urinating.  Blood in the urine.  Vomiting or being less hungry than normal.  Diarrhea or abdominal pain.  Vaginal discharge, if you are female. How is this diagnosed? This condition is diagnosed with a medical history and physical exam. You will also need to provide a urine sample to test your urine. Other tests may be done, including:  Blood tests.  Sexually transmitted disease (STD) testing. If you have had more than one UTI, a cystoscopy or imaging studies may be done to determine the cause of the infections. How is this treated? Treatment for this condition often includes a combination of two or more of the following:  Antibiotic medicine.  Other medicines to treat less common causes of UTI.  Over-the-counter medicines to treat pain.  Drinking enough water to stay hydrated. Follow these instructions at home:  Take over-the-counter and prescription medicines only as told by your health care provider.  If you were prescribed an antibiotic, take it as told by your health care provider. Do not stop taking the antibiotic even if you start to feel better.  Avoid alcohol, caffeine, tea, and carbonated beverages. They can irritate your bladder.  Drink enough fluid to keep your urine clear or pale yellow.  Keep all follow-up visits as told by your health care provider. This is important.  Make sure to:  Empty your bladder often and completely. Do not hold urine for long periods of time.  Empty your bladder before and after sex.  Wipe from front to back after a bowel movement if  you are female. Use each tissue one time when you wipe. Contact a health care provider if:  You have back pain.  You have a fever.  You feel nauseous or vomit.  Your symptoms do not get better after 3 days.  Your symptoms go away and then return. Get help right away if:  You have severe back pain or lower abdominal pain.  You are vomiting and cannot keep down  any medicines or water. This information is not intended to replace advice given to you by your health care provider. Make sure you discuss any questions you have with your health care provider. Document Released: 11/23/2004 Document Revised: 07/28/2015 Document Reviewed: 01/04/2015 Elsevier Interactive Patient Education  2017 [employer]

## 2016-04-25 NOTE — Progress Notes (Signed)
Subjective:  Patient ID: Valerie Reyes, female    DOB: 07-Jul-1985  Age: 31 y.o. MRN: [id number]  CC: Left flank pain  HPI Valerie Reyes is a 31 y.o. female with a PMH of pyelonephritis presents with a 2 day history of left flank pain, urinary urgency, and  urinary frequency. Urinary symptoms began after sexual intercourse. Pain waxes and wanes throughout the day. Has not taken anything for relief. Denies fever, chills, nausea, vomiting, genital lesions, vaginal discharge, suprapubic pain, dysuria, history of renal calculi.     Patient would also complains of right upper tooth pain. Says that the tooth is cracked. Pain began yesterday. Denies swelling, bleeding, suppuration.   Outpatient Medications Prior to Visit  Medication Sig Dispense Refill  . azithromycin (ZITHROMAX) 250 MG tablet Take 4 tablets (1,000 mg total) by mouth once. 4 tablet 0   No facility-administered medications prior to visit.      ROS Review of Systems  Constitutional: Negative for chills, fever and malaise/fatigue.  Eyes: Negative for blurred vision.  Respiratory: Negative for shortness of breath.   Cardiovascular: Negative for chest pain and palpitations.  Gastrointestinal: Positive for abdominal pain (Left flank pain). Negative for nausea.  Genitourinary: Positive for frequency and urgency. Negative for dysuria and hematuria.  Musculoskeletal: Negative for joint pain and myalgias.  Skin: Negative for rash.  Neurological: Negative for tingling and headaches.  Psychiatric/Behavioral: Negative for depression. The patient is not nervous/anxious.     Objective:  BP (!) 93/57 (BP Location: Right Arm, Patient Position: Sitting, Cuff Size: Normal)   Pulse 89   Temp 98.1 F (36.7 C) (Oral)   Ht 5' 2.5" (1.588 m)   Wt 128 lb 6.4 oz (58.2 kg)   LMP 04/21/2016 (Exact Date)   SpO2 97%   BMI 23.11 kg/m   BP/Weight 04/25/2016 12/20/2015 10/03/2015  Systolic BP 93 114 111  Diastolic BP 57 67 77  Wt. (Lbs)  128.4 128 134  BMI 23.11 23.41 25.32      Physical Exam  Constitutional: She is oriented to person, place, and time.  In mild discomfort due to left flank pain. Normal body habitus, polite.  HENT:  Tooth #3 with mild tenderness to palpation, no edema, no erythema, no bleeding, no suppuration, and no facial swelling.  Eyes: No scleral icterus.  Neck: Neck supple.  Cardiovascular: Normal rate, regular rhythm and normal heart sounds.   Pulmonary/Chest: Effort normal and breath sounds normal.  Abdominal: Soft. Bowel sounds are normal. There is tenderness (Left flank tenderness to palpation).  Lymphadenopathy:    She has no cervical adenopathy.  Neurological: She is alert and oriented to person, place, and time.  Skin: Skin is warm and dry. No rash noted.  Psychiatric: She has a normal mood and affect. Her behavior is normal. Thought content normal.     Assessment & Plan:   1. Urinary tract infection with hematuria, site unspecified - amoxicillin-clavulanate (AUGMENTIN) 875-125 MG tablet; Take 1 tablet by mouth 2 (two) times daily.  Dispense: 20 tablet; Refill: 0 -POCT uHCG not available today in clinic. I therefore chose category B abx and analgesic for her UTI.  -urine cx ordered. Will call pt with results.  2. Left flank pain -likely associated to UTI.  3. Tooth ache - acetaminophen (TYLENOL) 500 MG tablet; Take 2 tablets (1,000 mg total) by mouth every 8 (eight) hours as needed.  Dispense: 14 tablet; Refill: 0 - amoxicillin-clavulanate (AUGMENTIN) 875-125 MG tablet; Take 1 tablet by mouth 2 (two) times  daily.  Dispense: 20 tablet; Refill: 0   Meds ordered this encounter  Medications  . acetaminophen (TYLENOL) 500 MG tablet    Sig: Take 2 tablets (1,000 mg total) by mouth every 8 (eight) hours as needed.    Dispense:  14 tablet    Refill:  0    Order Specific Question:   Supervising Provider    Answer:   Quentin AngstJEGEDE, OLUGBEMIGA E [id number]  . amoxicillin-clavulanate  (AUGMENTIN) 875-125 MG tablet    Sig: Take 1 tablet by mouth 2 (two) times daily.    Dispense:  20 tablet    Refill:  0    Order Specific Question:   Supervising Provider    Answer:   Quentin AngstJEGEDE, OLUGBEMIGA E [[id number]]    Follow-up: PRN   Loletta Specteroger David Marijah Larranaga PA  *pt unable to provide urine sample initially. She was given ample time (>45 minutes) to produce urine.

## 2016-04-27 LAB — URINE CULTURE

## 2016-04-28 ENCOUNTER — Other Ambulatory Visit (INDEPENDENT_AMBULATORY_CARE_PROVIDER_SITE_OTHER): Payer: Self-pay | Admitting: Physician Assistant

## 2016-04-28 DIAGNOSIS — N390 Urinary tract infection, site not specified: Secondary | ICD-10-CM | POA: Insufficient documentation

## 2016-04-28 MED ORDER — CIPROFLOXACIN HCL 500 MG PO TABS: 500.0000 mg | ORAL_TABLET | Freq: Two times a day (BID) | ORAL | 0 refills | Status: DC

## 2016-04-28 NOTE — Progress Notes (Signed)
Ciprofloxacin rx to be given to pt if pregnancy test is negative.

## 2016-05-03 ENCOUNTER — Other Ambulatory Visit (INDEPENDENT_AMBULATORY_CARE_PROVIDER_SITE_OTHER): Payer: Medicaid Other

## 2016-05-03 DIAGNOSIS — Z3200 Encounter for pregnancy test, result unknown: Secondary | ICD-10-CM

## 2016-05-03 LAB — POCT URINE PREGNANCY: Preg Test, Ur: NEGATIVE

## 2016-05-04 ENCOUNTER — Telehealth (INDEPENDENT_AMBULATORY_CARE_PROVIDER_SITE_OTHER): Payer: Self-pay | Admitting: Physician Assistant

## 2016-05-04 NOTE — Telephone Encounter (Signed)
Patient called requesting lab results Please call back at :     [phone] (M)

## 2016-05-04 NOTE — Telephone Encounter (Signed)
Spoke with patient gave results of pregnancy test. And asked patient if she would be returning to pick up the Rx PCP printed, Patient stated she would come in tomorrow morning to pick it up. Maryjean Mornempestt S Roberts, CMA

## 2016-05-10 ENCOUNTER — Ambulatory Visit (INDEPENDENT_AMBULATORY_CARE_PROVIDER_SITE_OTHER): Payer: Medicaid Other | Admitting: Physician Assistant

## 2016-05-10 ENCOUNTER — Encounter (INDEPENDENT_AMBULATORY_CARE_PROVIDER_SITE_OTHER): Payer: Self-pay | Admitting: Physician Assistant

## 2016-05-10 VITALS — BP 126/83 | HR 73 | Temp 98.0°F | Ht 62.5 in | Wt 132.4 lb

## 2016-05-10 DIAGNOSIS — N390 Urinary tract infection, site not specified: Secondary | ICD-10-CM | POA: Diagnosis not present

## 2016-05-10 DIAGNOSIS — K0889 Other specified disorders of teeth and supporting structures: Secondary | ICD-10-CM

## 2016-05-10 LAB — POCT URINALYSIS DIPSTICK
Bilirubin, UA: NEGATIVE
Blood, UA: NEGATIVE
Glucose, UA: NEGATIVE
KETONES UA: NEGATIVE
LEUKOCYTES UA: NEGATIVE
NITRITE UA: POSITIVE
PH UA: 6
PROTEIN UA: NEGATIVE
Spec Grav, UA: 1.015
UROBILINOGEN UA: 0.2

## 2016-05-10 NOTE — Patient Instructions (Signed)

## 2016-05-10 NOTE — Progress Notes (Signed)
  Subjective:  Patient ID: Valerie Reyes, female    DOB: 05-18-1985  Age: 31 y.o. MRN: [id number]  CC:  F/u UTI and toothache  HPI Valerie Reyes is a 31 y.o. female with a recent hx of UTI and toothache, Presents for f/u of UTI and toothache. Patient was found to have E. Coli on cx. She was initially taking Augmentin due to unknown pregnancy status and unavailability of POCT HCG. Augmentin was found to be of intermediate resistance on cx. Was then prescribed Ciprofloxacin 500 due to susceptibility. Patient picked up Cipro rx but did not fill rx for Cipro. Urinary odor "odor came back today". No dysuria, urinary frequency, back pain, fever, chills, nausea, or vomiting.  Right upper tooth "feels cracked" in the posterior aspect of tooth. Pain 7/10 but can reach 10/10. Foods, drinks, and air aggravate pain.      Outpatient Medications Prior to Visit  Medication Sig Dispense Refill  . azithromycin (ZITHROMAX) 250 MG tablet Take 4 tablets (1,000 mg total) by mouth once. 4 tablet 0  . ciprofloxacin (CIPRO) 500 MG tablet Take 1 tablet (500 mg total) by mouth 2 (two) times daily. 6 tablet 0   No facility-administered medications prior to visit.      ROS Review of Systems  Constitutional: Negative for chills, fever and malaise/fatigue.  Eyes: Negative for blurred vision.  Respiratory: Negative for shortness of breath.   Cardiovascular: Negative for chest pain and palpitations.  Gastrointestinal: Negative for abdominal pain and nausea.  Genitourinary: Negative for dysuria, flank pain, frequency, hematuria and urgency.  Musculoskeletal: Negative for joint pain, myalgias and neck pain.  Skin: Negative for rash.  Neurological: Negative for tingling and headaches.  Psychiatric/Behavioral: Negative for depression. The patient is not nervous/anxious.     Objective:  BP 126/83 (BP Location: Right Arm, Patient Position: Sitting, Cuff Size: Normal)   Pulse 73   Temp 98 F (36.7 C) (Oral)    Ht 5' 2.5" (1.588 m)   Wt 132 lb 6.4 oz (60.1 kg)   LMP 04/21/2016 (Exact Date)   SpO2 96%   BMI 23.83 kg/m   BP/Weight 05/10/2016 04/25/2016 12/20/2015  Systolic BP 126 93 114  Diastolic BP 83 57 67  Wt. (Lbs) 132.4 128.4 128  BMI 23.83 23.11 23.41      Physical Exam  Constitutional: She is oriented to person, place, and time.  NAD, normal body habitus  HENT:  Head: Normocephalic and atraumatic.  Tooth number 3 and 4 without visual fracture, periodontitis, abscess, bleeding, suppuration. However, there is moderate TTP of tooth 3 and 4.  Pulmonary/Chest: Effort normal.  Genitourinary:  Genitourinary Comments: No CVA tenderness bilaterally  Musculoskeletal: Normal range of motion. She exhibits no edema.  Neurological: She is alert and oriented to person, place, and time.  Skin: Skin is warm and dry.  Psychiatric: She has a normal mood and affect. Her behavior is normal. Thought content normal.     Assessment & Plan:   1. Urinary tract infection without hematuria, site unspecified - Urinalysis Dipstick positive for Nitrite - Pt has not taken her ciprofloxacin yet. I have advised to take immediately. No signs of pyelonephritis.  2. Toothache - Tooth number 3 and/or 4. - Ambulatory referral to Dentistry   Follow-up: Return if symptoms worsen or fail to improve.   Loletta Specteroger David Gomez PA

## 2016-05-11 ENCOUNTER — Encounter (INDEPENDENT_AMBULATORY_CARE_PROVIDER_SITE_OTHER): Payer: Self-pay | Admitting: Physician Assistant

## 2016-05-30 ENCOUNTER — Ambulatory Visit (INDEPENDENT_AMBULATORY_CARE_PROVIDER_SITE_OTHER): Payer: Medicaid Other | Admitting: Physician Assistant

## 2016-07-20 ENCOUNTER — Encounter (HOSPITAL_COMMUNITY): Payer: Self-pay

## 2016-07-20 DIAGNOSIS — Y92018 Other place in single-family (private) house as the place of occurrence of the external cause: Secondary | ICD-10-CM | POA: Diagnosis not present

## 2016-07-20 DIAGNOSIS — W108XXA Fall (on) (from) other stairs and steps, initial encounter: Secondary | ICD-10-CM | POA: Diagnosis not present

## 2016-07-20 DIAGNOSIS — M546 Pain in thoracic spine: Secondary | ICD-10-CM | POA: Insufficient documentation

## 2016-07-20 DIAGNOSIS — F1210 Cannabis abuse, uncomplicated: Secondary | ICD-10-CM | POA: Insufficient documentation

## 2016-07-20 DIAGNOSIS — F1490 Cocaine use, unspecified, uncomplicated: Secondary | ICD-10-CM | POA: Diagnosis not present

## 2016-07-20 DIAGNOSIS — S62501A Fracture of unspecified phalanx of right thumb, initial encounter for closed fracture: Secondary | ICD-10-CM | POA: Insufficient documentation

## 2016-07-20 DIAGNOSIS — F17210 Nicotine dependence, cigarettes, uncomplicated: Secondary | ICD-10-CM | POA: Diagnosis not present

## 2016-07-20 DIAGNOSIS — Y939 Activity, unspecified: Secondary | ICD-10-CM | POA: Insufficient documentation

## 2016-07-20 DIAGNOSIS — Y999 Unspecified external cause status: Secondary | ICD-10-CM | POA: Diagnosis not present

## 2016-07-20 DIAGNOSIS — R55 Syncope and collapse: Secondary | ICD-10-CM | POA: Diagnosis present

## 2016-07-20 LAB — URINALYSIS, ROUTINE W REFLEX MICROSCOPIC
BILIRUBIN URINE: NEGATIVE
GLUCOSE, UA: NEGATIVE mg/dL
KETONES UR: 5 mg/dL — AB
LEUKOCYTES UA: NEGATIVE
NITRITE: NEGATIVE
PH: 5 (ref 5.0–8.0)
PROTEIN: NEGATIVE mg/dL
Specific Gravity, Urine: 1.023 (ref 1.005–1.030)

## 2016-07-20 LAB — CBC
HCT: 43.7 % (ref 36.0–46.0)
HEMOGLOBIN: 14.5 g/dL (ref 12.0–15.0)
MCH: 29.5 pg (ref 26.0–34.0)
MCHC: 33.2 g/dL (ref 30.0–36.0)
MCV: 89 fL (ref 78.0–100.0)
PLATELETS: 224 10*3/uL (ref 150–400)
RBC: 4.91 MIL/uL (ref 3.87–5.11)
RDW: 13.1 % (ref 11.5–15.5)
WBC: 8.1 10*3/uL (ref 4.0–10.5)

## 2016-07-20 LAB — BASIC METABOLIC PANEL
ANION GAP: 8 (ref 5–15)
BUN: 11 mg/dL (ref 6–20)
CALCIUM: 9.4 mg/dL (ref 8.9–10.3)
CO2: 24 mmol/L (ref 22–32)
CREATININE: 0.83 mg/dL (ref 0.44–1.00)
Chloride: 105 mmol/L (ref 101–111)
Glucose, Bld: 100 mg/dL — ABNORMAL HIGH (ref 65–99)
Potassium: 3.2 mmol/L — ABNORMAL LOW (ref 3.5–5.1)
SODIUM: 137 mmol/L (ref 135–145)

## 2016-07-20 NOTE — ED Triage Notes (Signed)
Pt reports having syncopal episode yesterday and falling down about 16 stairs. She does not remember the incident but states her boy friend and son witnessed it. The told he she was diaphoretic. She is now complaining of thoracic back pain and pain on entire right side. She states she woke up with posterior headache that is still present. Denies dizziness

## 2016-07-21 ENCOUNTER — Emergency Department (HOSPITAL_COMMUNITY): Payer: Medicaid Other

## 2016-07-21 ENCOUNTER — Encounter (HOSPITAL_COMMUNITY): Payer: Self-pay | Admitting: Emergency Medicine

## 2016-07-21 ENCOUNTER — Emergency Department (HOSPITAL_COMMUNITY)
Admission: EM | Admit: 2016-07-21 | Discharge: 2016-07-21 | Disposition: A | Discharge status: Home / Self Care | Payer: Medicaid Other | Attending: Emergency Medicine | Admitting: Emergency Medicine

## 2016-07-21 DIAGNOSIS — R55 Syncope and collapse: Secondary | ICD-10-CM

## 2016-07-21 DIAGNOSIS — S62501A Fracture of unspecified phalanx of right thumb, initial encounter for closed fracture: Secondary | ICD-10-CM

## 2016-07-21 DIAGNOSIS — F1490 Cocaine use, unspecified, uncomplicated: Secondary | ICD-10-CM

## 2016-07-21 DIAGNOSIS — T07XXXA Unspecified multiple injuries, initial encounter: Secondary | ICD-10-CM

## 2016-07-21 LAB — CBG MONITORING, ED: Glucose-Capillary: 98 mg/dL (ref 65–99)

## 2016-07-21 MED ORDER — IBUPROFEN 600 MG PO TABS: 600.0000 mg | ORAL_TABLET | Freq: Four times a day (QID) | ORAL | 0 refills | Status: DC | PRN

## 2016-07-21 MED ORDER — KETOROLAC TROMETHAMINE 15 MG/ML IJ SOLN
15.0000 mg | Freq: Once | INTRAMUSCULAR | Status: AC
  Administered 2016-07-21: 15 mg via INTRAVENOUS
  Filled 2016-07-21: qty 1

## 2016-07-21 NOTE — Progress Notes (Signed)
Orthopedic Tech Progress Note Patient Details:  Valerie Reyes 05-10-1985 [id number]  Ortho Devices Type of Ortho Device: Thumb spica splint, Arm sling Splint Material: Fiberglass Ortho Device/Splint Location: rue Ortho Device/Splint Interventions: Ordered, Application   Trinna PostMartinez, Aashvi Rezabek J 07/21/2016, 3:26 AM

## 2016-07-21 NOTE — ED Notes (Signed)
Patient transported to X-ray 

## 2016-07-21 NOTE — ED Provider Notes (Signed)
[facility]-EMERGENCY DEPT Provider Note   CSN: [id number] Arrival date & time: 07/20/16  1933 By signing my name below, I, Valerie Reyes, attest that this documentation has been prepared under the direction and in the presence of Derwood Kaplan, MD . Electronically Signed: Levon Reyes, Scribe. 07/21/2016. 1:33 AM.   History   Chief Complaint Chief Complaint  Patient presents with  . Near Syncope  . Back Pain    The history is provided by the patient. No language interpreter was used.    HPI Comments:  Valerie Reyes is a 31 y.o. female who presents to the Emergency Department complaining of  Sudden onset, constant, gradually worsening right lateral back pain s/p fall at 5 this AM. Per pt, she fell down her stairs this morning. She reports that she remembers watching TV in bed and then later waking up with right-sided back pain, but does not remember her fall. Pt's boyfriend found her at the bottom of the stairs, and reported that she was diaphoretic at that time. Pt notes associated pain to her posterior neck and right lateral thumb, as well as numbness to her right thumb. Back pain is exacerbated by direct pressure; neck pain is worsened by movement. Pt reports she drinks alcohol and smokes marijuana regularly. She endorses recent cocaine use and states she uses cocaine 1-2 times per year. She reports h/o blood clots in legs due to birth control pills. She denies h/o seizures or syncope. No personal history of MI or stroke. Pt denies any family history of sudden cardiac death. She denies any shaking or tremors and has no other complaints at this time.  Pt is not currently followed by a PCP.   Past Medical History:  Diagnosis Date  . Bronchitis   . History of kidney infection   . Medical history non-contributory   . Pregnancy   . Trichomonosis     Patient Active Problem List   Diagnosis Date Noted  . UTI (urinary tract infection) 04/28/2016  . Late prenatal care 04/23/2012     Past Surgical History:  Procedure Laterality Date  . CESAREAN SECTION N/A 09/01/2012   Procedure: CESAREAN SECTION;  Surgeon: Lesly Dukes, MD;  Location: [facility];  Service: Obstetrics;  Laterality: N/A;  . NO PAST SURGERIES      OB History    Gravida Para Term Preterm AB Living   1 1 1  0 0 1   SAB TAB Ectopic Multiple Live Births   0 0 0 0 1      Home Medications    Prior to Admission medications   Medication Sig Start Date End Date Taking? Authorizing Provider  azithromycin (ZITHROMAX) 250 MG tablet Take 4 tablets (1,000 mg total) by mouth once. 06/18/13   Rodolph Bong, MD  ciprofloxacin (CIPRO) 500 MG tablet Take 1 tablet (500 mg total) by mouth 2 (two) times daily. 04/28/16   Loletta Specter, PA-C  ibuprofen (ADVIL,MOTRIN) 600 MG tablet Take 1 tablet (600 mg total) by mouth every 6 (six) hours as needed. 07/21/16   Derwood Kaplan, MD    Family History Family History  Problem Relation Age of Onset  . Cancer Mother   . Diabetes Mother   . Hypertension Mother   . Cancer Maternal Aunt        lung  . Diabetes Maternal Grandmother   . Stroke Maternal Grandmother   . Hypertension Maternal Grandmother   . Hypertension Paternal Grandmother   . Diabetes Paternal Grandmother  Social History Social History  Substance Use Topics  . Smoking status: Current Every Day Smoker    Packs/day: 1.00    Types: Cigarettes    Last attempt to quit: 04/09/2012  . Smokeless tobacco: Never Used  . Alcohol use No     Allergies   Patient has no known allergies.  Review of Systems Review of Systems  Musculoskeletal: Positive for back pain, myalgias and neck pain.  Neurological: Positive for syncope and numbness. Negative for tremors.  Hematological: Does not bruise/bleed easily.  All other systems reviewed and are negative.  Physical Exam Updated Vital Signs BP 116/89   Pulse (!) 51   Temp 98.4 F (36.9 C) (Oral)   Resp 18   Ht 5\' 1"  (1.549 m)   Wt 59 kg (130 lb)    LMP 07/20/2016   SpO2 95%   BMI 24.56 kg/m   Physical Exam  Constitutional: She is oriented to person, place, and time. She appears well-developed and well-nourished. No distress.  HENT:  Head: Normocephalic and atraumatic.  Eyes: Conjunctivae are normal.  Cardiovascular: Normal rate.   Bilateral 2+ DP and radial pulse  Pulmonary/Chest: Effort normal.  Abdominal: She exhibits no distension.  Musculoskeletal:  Tenderness to scapular region bilaterally. No bony tenderness or step-offs. Tenderness is worse with twisting motion of upper body. Tenderness to right anterior leg and mid thigh w/o visible ecchymosis. No gross deformities to upper or lower extremities. TTP of right thumb and digit 4. Subjective numbness to dorsal aspect of right thumb. No tenderness of snuff box.  Neurological: She is alert and oriented to person, place, and time.  Skin: Skin is warm and dry.  Nursing note and vitals reviewed.  ED Treatments / Results  DIAGNOSTIC STUDIES:  Oxygen Saturation is 99% on RA, normal by my interpretation.    COORDINATION OF CARE:  12:55 AM Discussed treatment plan with pt at bedside and pt agreed to plan.  Labs (all labs ordered are listed, but only abnormal results are displayed) Labs Reviewed  BASIC METABOLIC PANEL - Abnormal; Notable for the following:       Result Value   Potassium 3.2 (*)    Glucose, Bld 100 (*)    All other components within normal limits  URINALYSIS, ROUTINE W REFLEX MICROSCOPIC - Abnormal; Notable for the following:    APPearance HAZY (*)    Hgb urine dipstick SMALL (*)    Ketones, ur 5 (*)    Bacteria, UA RARE (*)    Squamous Epithelial / LPF 0-5 (*)    All other components within normal limits  CBC  CBG MONITORING, ED    EKG  EKG Interpretation  Date/Time:  Thursday Jul 20 2016 20:32:49 EDT Ventricular Rate:  76 PR Interval:  158 QRS Duration: 82 QT Interval:  364 QTC Calculation: 409 R Axis:   73 Text Interpretation:  Normal sinus  rhythm with sinus arrhythmia Right atrial enlargement Borderline ECG diffuse ST elevation PR elevation in avR No old tracing to compare Confirmed by Derwood Kaplan [phone]) on 07/21/2016 12:28:13 AM       Radiology Dg Chest 2 View  Result Date: 07/21/2016 CLINICAL DATA:  Acute onset of syncope and fall down 16 stairs. Diaphoresis. Upper back pain and right-sided chest pain. Initial encounter. EXAM: CHEST  2 VIEW COMPARISON:  None. FINDINGS: The lungs are well-aerated and clear. There is no evidence of focal opacification, pleural effusion or pneumothorax. The heart is normal in size; the mediastinal contour is within normal  limits. No acute osseous abnormalities are seen. IMPRESSION: No acute cardiopulmonary process seen. No displaced rib fractures identified. Electronically Signed   By: Roanna RaiderJeffery  Chang M.D.   On: 07/21/2016 01:49   Dg Hand Complete Right  Result Date: 07/21/2016 CLINICAL DATA:  Status post fall down 16 stairs, with pain at the right thumb and fourth and fifth metacarpophalangeal joints. Initial encounter. EXAM: RIGHT HAND - COMPLETE 3+ VIEW COMPARISON:  None. FINDINGS: There is a small avulsion fracture at the radial aspect of the base of the first proximal phalanx. This extends to the first metacarpophalangeal joint. No additional fractures are seen. Visualized joint spaces are otherwise preserved. The carpal rows appear grossly intact, and demonstrate normal alignment. IMPRESSION: Small avulsion fracture at the radial aspect of the base of the first proximal phalanx, extending to the first metacarpophalangeal joint. Electronically Signed   By: Roanna RaiderJeffery  Chang M.D.   On: 07/21/2016 02:19    Procedures Procedures (including critical care time) SPLINT APPLICATION Date/Time: 0400 AM Authorized by: Derwood KaplanNanavati, Anjanette Gilkey Consent: Verbal consent obtained. Risks and benefits: risks, benefits and alternatives were discussed Consent given by: patient Splint applied by: orthopedic  technician Location details: Right hand Splint type: thumbspica Supplies used: webril, fiberglass, sling, ace wrap Post-procedure: The splinted body part was neurovascularly unchanged following the procedure. Patient tolerance: Patient tolerated the procedure well with no immediate complications.     Medications Ordered in ED Medications  ketorolac (TORADOL) 15 MG/ML injection 15 mg (15 mg Intravenous Given 07/21/16 0149)     Initial Impression / Assessment and Plan / ED Course  I have reviewed the triage vital signs and the nursing notes.  Pertinent labs & imaging results that were available during my care of the patient were reviewed by me and considered in my medical decision making (see chart for details).     Pt comes in with cc of fall and resultant back pain and hand pain. Back pain is generalized - CXR is neg. Spine exam is normal, and there is no neuro complains that raise suspicion for cord involvement.  Pt also has thumb pain - there is an avulsion fracture. Hand f/u given.  Pt's fall could have been syncope related. Pt is not clear as to what happened. She admits to binge cocaine use and drinking 2-3 days ago - but she denies being intoxicated yday before she fell. She has amnesia surrounding the fall. Could be TBI. Allegedly her boy friend didn't see any seizure like activity. There is no known cardiac risk factors and the ekg is WNL.  We will monitor pt closely over tele. Anticipate d/c.   Final Clinical Impressions(s) / ED Diagnoses   Final diagnoses:  Closed avulsion fracture of phalanx of right thumb, initial encounter  Syncope and collapse  Cocaine use  Multiple contusions    New Prescriptions Discharge Medication List as of 07/21/2016  4:19 AM    START taking these medications   Details  ibuprofen (ADVIL,MOTRIN) 600 MG tablet Take 1 tablet (600 mg total) by mouth every 6 (six) hours as needed., Starting Fri 07/21/2016, Print          Derwood KaplanNanavati,  [place]en Lesure, MD 07/21/16 [phone]

## 2016-07-21 NOTE — Discharge Instructions (Signed)
THE XRAYS SHOWS A THUMB FRACTURE. Please see the hand surgeon as requested.  We are not sure why you fainted. Cardiac monitoring in the ER is normal. It is possible that the fainting could be due to arrhythmia from cocaine use.... But we are not sure. We recommend Cardiology follow up for further evaluation of the fainting. Return to the ER if you faint again.

## 2017-01-16 ENCOUNTER — Ambulatory Visit (INDEPENDENT_AMBULATORY_CARE_PROVIDER_SITE_OTHER): Payer: Self-pay | Admitting: Physician Assistant

## 2017-05-16 ENCOUNTER — Other Ambulatory Visit: Payer: Self-pay

## 2017-05-16 ENCOUNTER — Ambulatory Visit (HOSPITAL_COMMUNITY)
Admission: EM | Admit: 2017-05-16 | Discharge: 2017-05-16 | Disposition: A | Discharge status: Home / Self Care | Payer: Medicaid Other | Attending: Family Medicine | Admitting: Family Medicine

## 2017-05-16 ENCOUNTER — Encounter (HOSPITAL_COMMUNITY): Payer: Self-pay | Admitting: Emergency Medicine

## 2017-05-16 DIAGNOSIS — Z8249 Family history of ischemic heart disease and other diseases of the circulatory system: Secondary | ICD-10-CM | POA: Insufficient documentation

## 2017-05-16 DIAGNOSIS — R110 Nausea: Secondary | ICD-10-CM | POA: Diagnosis not present

## 2017-05-16 DIAGNOSIS — N3090 Cystitis, unspecified without hematuria: Secondary | ICD-10-CM

## 2017-05-16 DIAGNOSIS — Z823 Family history of stroke: Secondary | ICD-10-CM | POA: Diagnosis not present

## 2017-05-16 DIAGNOSIS — Z79899 Other long term (current) drug therapy: Secondary | ICD-10-CM | POA: Insufficient documentation

## 2017-05-16 DIAGNOSIS — Z833 Family history of diabetes mellitus: Secondary | ICD-10-CM | POA: Insufficient documentation

## 2017-05-16 DIAGNOSIS — F17210 Nicotine dependence, cigarettes, uncomplicated: Secondary | ICD-10-CM | POA: Insufficient documentation

## 2017-05-16 DIAGNOSIS — M545 Low back pain: Secondary | ICD-10-CM | POA: Insufficient documentation

## 2017-05-16 DIAGNOSIS — Z791 Long term (current) use of non-steroidal anti-inflammatories (NSAID): Secondary | ICD-10-CM | POA: Insufficient documentation

## 2017-05-16 DIAGNOSIS — Z801 Family history of malignant neoplasm of trachea, bronchus and lung: Secondary | ICD-10-CM | POA: Insufficient documentation

## 2017-05-16 DIAGNOSIS — Z87440 Personal history of urinary (tract) infections: Secondary | ICD-10-CM | POA: Insufficient documentation

## 2017-05-16 DIAGNOSIS — N898 Other specified noninflammatory disorders of vagina: Secondary | ICD-10-CM

## 2017-05-16 DIAGNOSIS — Z9889 Other specified postprocedural states: Secondary | ICD-10-CM | POA: Diagnosis not present

## 2017-05-16 LAB — POCT URINALYSIS DIP (DEVICE)
Bilirubin Urine: NEGATIVE
GLUCOSE, UA: NEGATIVE mg/dL
LEUKOCYTES UA: NEGATIVE
NITRITE: POSITIVE — AB
PH: 6 (ref 5.0–8.0)
Protein, ur: NEGATIVE mg/dL
Specific Gravity, Urine: >=1.03 (ref 1.005–1.030)
Urobilinogen, UA: 0.2 mg/dL (ref 0.0–1.0)

## 2017-05-16 LAB — POCT PREGNANCY, URINE: PREG TEST UR: NEGATIVE

## 2017-05-16 MED ORDER — METRONIDAZOLE 500 MG PO TABS: 500.0000 mg | ORAL_TABLET | Freq: Two times a day (BID) | ORAL | 0 refills | Status: DC

## 2017-05-16 MED ORDER — FLUCONAZOLE 150 MG PO TABS: 150.0000 mg | ORAL_TABLET | Freq: Every day | ORAL | 0 refills | Status: DC

## 2017-05-16 MED ORDER — CEPHALEXIN 500 MG PO CAPS: 500.0000 mg | ORAL_CAPSULE | Freq: Two times a day (BID) | ORAL | 0 refills | Status: AC

## 2017-05-16 NOTE — ED Provider Notes (Signed)
[facility]    CSN: [id number] Arrival date & time: 05/16/17  1031     History   Chief Complaint Chief Complaint  Patient presents with  . Urinary Tract Infection    HPI Valerie Reyes is a 32 y.o. female.   32 year old female comes in for 3-day history of vaginal discharge with odor.  She also has some right lower back pain with nausea. Has had some urinary frequency, urgency, without dysuria/hematuria. Denies abdominal pain, diarrhea, constipation. Took tylenol for the right lower back pain with some improvements. Denies current pain.  Denies fever, chills, night sweats.  Sexually active with one female partner, occasional condom use.  Denies of the birth control use.  LMP 04/21/2017.  Patient worried about vaginal odor, states had a history of trichomonas with the same partner, and would like to cover for trich.       Past Medical History:  Diagnosis Date  . Bronchitis   . History of kidney infection   . Medical history non-contributory   . Pregnancy   . Trichomonosis     Patient Active Problem List   Diagnosis Date Noted  . UTI (urinary tract infection) 04/28/2016  . Late prenatal care 04/23/2012    Past Surgical History:  Procedure Laterality Date  . CESAREAN SECTION N/A 09/01/2012   Procedure: CESAREAN SECTION;  Surgeon: Lesly DukesKelly H Leggett, MD;  Location: [facility];  Service: Obstetrics;  Laterality: N/A;  . NO PAST SURGERIES      OB History    Gravida Para Term Preterm AB Living   1 1 1  0 0 1   SAB TAB Ectopic Multiple Live Births   0 0 0 0 1       Home Medications    Prior to Admission medications   Medication Sig Start Date End Date Taking? Authorizing Provider  cephALEXin (KEFLEX) 500 MG capsule Take 1 capsule (500 mg total) by mouth 2 (two) times daily for 5 days. 05/16/17 05/21/17  Belinda FisherYu, Bazil Dhanani V, PA-C  fluconazole (DIFLUCAN) 150 MG tablet Take 1 tablet (150 mg total) by mouth daily. Take second dose 72 hours later if symptoms still persists.  05/16/17   Cathie HoopsYu, Atticus Lemberger V, PA-C  ibuprofen (ADVIL,MOTRIN) 600 MG tablet Take 1 tablet (600 mg total) by mouth every 6 (six) hours as needed. 07/21/16   Derwood KaplanNanavati, Ankit, MD  metroNIDAZOLE (FLAGYL) 500 MG tablet Take 1 tablet (500 mg total) by mouth 2 (two) times daily. 05/16/17   Belinda FisherYu, Lysa Livengood V, PA-C    Family History Family History  Problem Relation Age of Onset  . Cancer Mother   . Diabetes Mother   . Hypertension Mother   . Cancer Maternal Aunt        lung  . Diabetes Maternal Grandmother   . Stroke Maternal Grandmother   . Hypertension Maternal Grandmother   . Hypertension Paternal Grandmother   . Diabetes Paternal Grandmother     Social History Social History   Tobacco Use  . Smoking status: Current Every Day Smoker    Packs/day: 1.00    Types: Cigarettes    Last attempt to quit: 04/09/2012    Years since quitting: 5.1  . Smokeless tobacco: Never Used  Substance Use Topics  . Alcohol use: No  . Drug use: Yes    Types: Marijuana, Cocaine    Comment: cocaine "occassional"     Allergies   Patient has no known allergies.   Review of Systems Review of Systems  Reason unable to perform  ROS: See HPI as above.     Physical Exam Triage Vital Signs ED Triage Vitals  Enc Vitals Group     BP 05/16/17 1150 (!) 132/95     Pulse Rate 05/16/17 1150 83     Resp 05/16/17 1150 18     Temp 05/16/17 1150 98 F (36.7 C)     Temp Source 05/16/17 1150 Oral     SpO2 05/16/17 1150 100 %     Weight --      Height --      Head Circumference --      Peak Flow --      Pain Score 05/16/17 1158 10     Pain Loc --      Pain Edu? --      Excl. in GC? --    No data found.  Updated Vital Signs BP (!) 132/95 (BP Location: Left Arm) Comment: notified Teresa  Pulse 83   Temp 98 F (36.7 C) (Oral)   Resp 18   LMP 04/21/2017 (Approximate)   SpO2 100%   Physical Exam  Constitutional: She is oriented to person, place, and time. She appears well-developed and well-nourished. No distress.    HENT:  Head: Normocephalic and atraumatic.  Eyes: Conjunctivae are normal. Pupils are equal, round, and reactive to light.  Cardiovascular: Normal rate, regular rhythm and normal heart sounds. Exam reveals no gallop and no friction rub.  No murmur heard. Pulmonary/Chest: Effort normal and breath sounds normal. She has no wheezes. She has no rales.  Abdominal: Soft. Bowel sounds are normal. She exhibits no mass. There is no tenderness. There is no rebound, no guarding and no CVA tenderness.  Neurological: She is alert and oriented to person, place, and time.  Skin: Skin is warm and dry.  Psychiatric: She has a normal mood and affect. Her behavior is normal. Judgment normal.     UC Treatments / Results  Labs (all labs ordered are listed, but only abnormal results are displayed) Labs Reviewed  POCT URINALYSIS DIP (DEVICE) - Abnormal; Notable for the following components:      Result Value   Ketones, ur TRACE (*)    Hgb urine dipstick TRACE (*)    Nitrite POSITIVE (*)    All other components within normal limits  URINE CULTURE  POCT PREGNANCY, URINE  URINE CYTOLOGY ANCILLARY ONLY    EKG  EKG Interpretation None       Radiology No results found.  Procedures Procedures (including critical care time)  Medications Ordered in UC Medications - No data to display   Initial Impression / Assessment and Plan / UC Course  I have reviewed the triage vital signs and the nursing notes.  Pertinent labs & imaging results that were available during my care of the patient were reviewed by me and considered in my medical decision making (see chart for details).     Urine dipstick positive for UTI. Start antibiotics as directed. Push fluids.  We will treat empirically for yeast, bacterial vaginitis, trichomonas.  Start Flagyl as directed.  Diflucan as directed.  Cytology and urine culture sent.  Return precautions given.  Patient expresses understanding and agrees to plan.  Final  Clinical Impressions(s) / UC Diagnoses   Final diagnoses:  Cystitis  Vaginal discharge    ED Discharge Orders        Ordered    metroNIDAZOLE (FLAGYL) 500 MG tablet  2 times daily     05/16/17 1220    cephALEXin (KEFLEX)  500 MG capsule  2 times daily     05/16/17 1220    fluconazole (DIFLUCAN) 150 MG tablet  Daily     05/16/17 1221        Belinda Fisher, PA-C 05/16/17 1225

## 2017-05-16 NOTE — ED Triage Notes (Signed)
Pt reports three days of vaginal discharge with a slight odor and right lower back pain with some nausea.

## 2017-05-16 NOTE — Discharge Instructions (Signed)
Your urine was positive for an urinary tract infection. Start keflex as directed. Keep hydrated, your urine should be clear to pale yellow in color. You were treated empirically for yeast, trichomonas, bacterial vaginitis. Start flagyl as directed. Diflucan for yeast. Cytology sent, you will be contacted with any positive results that requires further treatment. Refrain from sexual activity and alcohol use for the next 7 days. Monitor for any worsening of symptoms, fever, worsening abdominal pain, nausea/vomiting, flank pain, follow up for reevaluation.

## 2017-05-17 LAB — URINE CULTURE: CULTURE: NO GROWTH

## 2017-05-17 LAB — URINE CYTOLOGY ANCILLARY ONLY
Chlamydia: NEGATIVE
Neisseria Gonorrhea: NEGATIVE
Trichomonas: NEGATIVE

## 2017-05-18 LAB — URINE CYTOLOGY ANCILLARY ONLY: CANDIDA VAGINITIS: NEGATIVE

## 2017-06-06 ENCOUNTER — Encounter: Payer: Medicaid Other | Admitting: Obstetrics & Gynecology

## 2018-04-12 ENCOUNTER — Ambulatory Visit (HOSPITAL_COMMUNITY)
Admission: EM | Admit: 2018-04-12 | Discharge: 2018-04-12 | Disposition: A | Discharge status: Home / Self Care | Payer: Medicaid Other | Attending: Internal Medicine | Admitting: Internal Medicine

## 2018-04-12 ENCOUNTER — Encounter (HOSPITAL_COMMUNITY): Payer: Self-pay | Admitting: Emergency Medicine

## 2018-04-12 DIAGNOSIS — Z3202 Encounter for pregnancy test, result negative: Secondary | ICD-10-CM | POA: Diagnosis not present

## 2018-04-12 DIAGNOSIS — Z202 Contact with and (suspected) exposure to infections with a predominantly sexual mode of transmission: Secondary | ICD-10-CM

## 2018-04-12 DIAGNOSIS — W460XXA Contact with hypodermic needle, initial encounter: Secondary | ICD-10-CM | POA: Insufficient documentation

## 2018-04-12 DIAGNOSIS — N760 Acute vaginitis: Secondary | ICD-10-CM | POA: Insufficient documentation

## 2018-04-12 DIAGNOSIS — Z113 Encounter for screening for infections with a predominantly sexual mode of transmission: Secondary | ICD-10-CM | POA: Diagnosis not present

## 2018-04-12 DIAGNOSIS — Z7721 Contact with and (suspected) exposure to potentially hazardous body fluids: Secondary | ICD-10-CM | POA: Insufficient documentation

## 2018-04-12 DIAGNOSIS — R21 Rash and other nonspecific skin eruption: Secondary | ICD-10-CM

## 2018-04-12 DIAGNOSIS — W461XXA Contact with contaminated hypodermic needle, initial encounter: Secondary | ICD-10-CM

## 2018-04-12 DIAGNOSIS — N898 Other specified noninflammatory disorders of vagina: Secondary | ICD-10-CM

## 2018-04-12 DIAGNOSIS — Y990 Civilian activity done for income or pay: Secondary | ICD-10-CM | POA: Diagnosis not present

## 2018-04-12 LAB — POCT URINALYSIS DIP (DEVICE)
BILIRUBIN URINE: NEGATIVE
GLUCOSE, UA: NEGATIVE mg/dL
KETONES UR: NEGATIVE mg/dL
LEUKOCYTE UA: NEGATIVE
Nitrite: POSITIVE — AB
PROTEIN: NEGATIVE mg/dL
SPECIFIC GRAVITY, URINE: 1.025 (ref 1.005–1.030)
Urobilinogen, UA: 0.2 mg/dL (ref 0.0–1.0)
pH: 6 (ref 5.0–8.0)

## 2018-04-12 LAB — POCT PREGNANCY, URINE: PREG TEST UR: NEGATIVE

## 2018-04-12 MED ORDER — AZITHROMYCIN 250 MG PO TABS
ORAL_TABLET | ORAL | Status: AC
  Filled 2018-04-12: qty 4

## 2018-04-12 MED ORDER — CEFTRIAXONE SODIUM 250 MG IJ SOLR
INTRAMUSCULAR | Status: AC
  Filled 2018-04-12: qty 250

## 2018-04-12 MED ORDER — AZITHROMYCIN 250 MG PO TABS
1000.0000 mg | ORAL_TABLET | Freq: Once | ORAL | Status: AC
Start: 2018-04-12 — End: 2018-04-12
  Administered 2018-04-12: 1000 mg via ORAL

## 2018-04-12 MED ORDER — CEFTRIAXONE SODIUM 250 MG IJ SOLR
250.0000 mg | Freq: Once | INTRAMUSCULAR | Status: AC
  Administered 2018-04-12: 250 mg via INTRAMUSCULAR

## 2018-04-12 NOTE — ED Triage Notes (Signed)
Pt requesting STD screening and also sts had needle stick at her job 3 weeks ago

## 2018-04-12 NOTE — ED Provider Notes (Signed)
[facility]    CSN: [id number] Arrival date & time: 04/12/18  1253     History   Chief Complaint Chief Complaint  Patient presents with  . Exposure to STD    HPI Valerie Reyes is a 33 y.o. female. She presents today with profuse vaginal discharge with odor for 2.5 weeks, started after her most recent period.  Had a lot of intense pelvic cramping before the period started, improved now.  No change in BMs, no dysuria, no urinary frequency.  No fever.  No malaise.   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  Has not had testing or care for the needlestick.  No persistent lesion, is worried about disease exposure from stick. Has had some difficulty recently with small skin lesions, like hair bumps, maybe 4-6 all together.  HPI  Past Medical History:  Diagnosis Date  . Bronchitis   . History of kidney infection   . Medical history non-contributory   . Pregnancy   . Trichomonosis     Patient Active Problem List   Diagnosis Date Noted  . UTI (urinary tract infection) 04/28/2016  . Late prenatal care 04/23/2012    Past Surgical History:  Procedure Laterality Date  . CESAREAN SECTION N/A 09/01/2012   Procedure: CESAREAN SECTION;  Surgeon: Lesly DukesKelly H Leggett, MD;  Location: [facility];  Service: Obstetrics;  Laterality: N/A;  . NO PAST SURGERIES      OB History    Gravida  1   Para  1   Term  1   Preterm  0   AB  0   Living  1     SAB  0   TAB  0   Ectopic  0   Multiple  0   Live Births  1            Home Medications    Prior to Admission medications   Medication Sig Start Date End Date Taking? Authorizing Provider  fluconazole (DIFLUCAN) 150 MG tablet Take 1 tablet (150 mg total) by mouth daily. Take second dose 72 hours later if symptoms still persists. Patient not taking: Reported on 04/12/2018 05/16/17   Belinda FisherYu, Amy V, PA-C  ibuprofen (ADVIL,MOTRIN) 600 MG  tablet Take 1 tablet (600 mg total) by mouth every 6 (six) hours as needed. 07/21/16   Derwood KaplanNanavati, Ankit, MD  metroNIDAZOLE (FLAGYL) 500 MG tablet Take 1 tablet (500 mg total) by mouth 2 (two) times daily. Patient not taking: Reported on 04/12/2018 05/16/17   Lurline IdolYu, Amy V, PA-C    Family History Family History  Problem Relation Age of Onset  . Cancer Mother   . Diabetes Mother   . Hypertension Mother   . Cancer Maternal Aunt        lung  . Diabetes Maternal Grandmother   . Stroke Maternal Grandmother   . Hypertension Maternal Grandmother   . Hypertension Paternal Grandmother   . Diabetes Paternal Grandmother     Social History Social History   Tobacco Use  . Smoking status: Current Every Day Smoker    Packs/day: 1.00    Types: Cigarettes    Last attempt to quit: 04/09/2012    Years since quitting: 6.0  . Smokeless tobacco: Never Used  Substance Use Topics  . Alcohol use: No  . Drug use: Yes    Types: Marijuana, Cocaine    Comment: cocaine "occassional"  Allergies   Patient has no known allergies.   Review of Systems Review of Systems  All other systems reviewed and are negative.    Physical Exam Triage Vital Signs ED Triage Vitals  Enc Vitals Group     BP 04/12/18 1328 (!) 132/92     Pulse Rate 04/12/18 1328 71     Resp 04/12/18 1328 18     Temp 04/12/18 1328 98.6 F (37 C)     Temp Source 04/12/18 1328 Oral     SpO2 04/12/18 1328 99 %     Weight --      Height --      Pain Score 04/12/18 1329 0     Pain Loc --    Updated Vital Signs BP (!) 132/92 (BP Location: Right Arm)   Pulse 71   Temp 98.6 F (37 C) (Oral)   Resp 18   SpO2 99%  Physical Exam Vitals signs and nursing note reviewed.  Constitutional:      General: She is not in acute distress.    Comments: Alert, nicely groomed  HENT:     Head: Atraumatic.  Eyes:     Comments: Conjugate gaze, no eye redness/drainage  Neck:     Musculoskeletal: Neck supple.  Cardiovascular:     Rate and  Rhythm: Normal rate.  Pulmonary:     Effort: No respiratory distress.  Abdominal:     General: There is no distension.     Palpations: Abdomen is soft.     Tenderness: There is no guarding or rebound.     Comments: Mildly tender to deep palpation in the bilateral low pelvic quadrants  Musculoskeletal: Normal range of motion.     Comments: No leg swelling  Skin:    General: Skin is warm and dry.     Comments: No cyanosis Couple small (61mm) hyperpigmented papules, one on right forearm, one near left nipple, not pointing/red.  Patient says one currently on backside as well.  Neurological:     Mental Status: She is alert and oriented to person, place, and time.      UC Treatments / Results  Labs Results for orders placed or performed during the hospital encounter of 04/12/18  Urine culture  Result Value Ref Range   Specimen Description URINE, RANDOM    Special Requests NONE    Culture (A)     >=100,000 COLONIES/mL GROUP B STREP(S.AGALACTIAE)ISOLATED TESTING AGAINST S. AGALACTIAE NOT ROUTINELY PERFORMED DUE TO PREDICTABILITY OF AMP/PEN/VAN SUSCEPTIBILITY. Performed at [facility], [address] [place], [place] [address]    Report Status 04/13/2018 FINAL   HIV Antibody (routine testing w rflx)  Result Value Ref Range   HIV Screen 4th Generation wRfx Non Reactive Non Reactive  Hepatitis panel, acute  Result Value Ref Range   Hepatitis B Surface Ag Negative Negative   HCV Ab <0.1 0.0 - 0.9 s/co ratio   Hep A IgM Negative Negative   Hep B C IgM Negative Negative  POCT urinalysis dip (device)  Result Value Ref Range   Glucose, UA NEGATIVE NEGATIVE mg/dL   Bilirubin Urine NEGATIVE NEGATIVE   Ketones, ur NEGATIVE NEGATIVE mg/dL   Specific Gravity, Urine 1.025 1.005 - 1.030   Hgb urine dipstick TRACE (A) NEGATIVE   pH 6.0 5.0 - 8.0   Protein, ur NEGATIVE NEGATIVE mg/dL   Urobilinogen, UA 0.2 0.0 - 1.0 mg/dL   Nitrite POSITIVE (A) NEGATIVE   Leukocytes,Ua NEGATIVE  NEGATIVE  Pregnancy, urine POC  Result Value Ref Range   Preg Test, Ur NEGATIVE NEGATIVE    Procedures Procedures (including critical care time)  Medications Ordered in UC Medications  cefTRIAXone (ROCEPHIN) injection 250 mg (250 mg Intramuscular Given 04/12/18 1417)  azithromycin (ZITHROMAX) tablet 1,000 mg (1,000 mg Oral Given 04/12/18 1417)     Final Clinical Impressions(s) / UC Diagnoses   Final diagnoses:  Possible exposure to STD  Vaginitis and vulvovaginitis  Accidental hypodermic needlestick injury  skin bumps:  Possible impetigo (resolving) vs staph folliculitis, discussed environmental hygiene (sheets/towels, frequently worn clothing)   Discharge Instructions     Urine tests for pregnancy and UTI were done at urgent care today, pregnancy test was negative.  Urine culture, vaginal swab testing for common pelvic infections and blood tests for HIV and hepatitis due to the needlestick exposure 3 weeks ago are all pending.  Injection of rocephin with oral dose of zithromax (antibiotics) were given at urgent care today.  Please refrain from intercourse for 7 days to give the medicines time to work.  Using condoms may decrease the risk of infection. The urgent care will contact you if further treatment is needed based on test results.  Followup with your primary care provider for followup testing; after a needlestick, typically HIV and hepatitis testing is done several times over several months to monitor after a needlestick exposure.  The risk of infection is low, just important to monitor in case.   ED Prescriptions    None        Isa Rankin, MD 04/13/18 1726

## 2018-04-12 NOTE — Discharge Instructions (Addendum)
Urine tests for pregnancy and UTI were done at urgent care today, pregnancy test was negative.  Urine culture, vaginal swab testing for common pelvic infections and blood tests for HIV and hepatitis due to the needlestick exposure 3 weeks ago are all pending.  Injection of rocephin with oral dose of zithromax (antibiotics) were given at urgent care today.  Please refrain from intercourse for 7 days to give the medicines time to work.  Using condoms may decrease the risk of infection. The urgent care will contact you if further treatment is needed based on test results.  Followup with your primary care provider for followup testing; after a needlestick, typically HIV and hepatitis testing is done several times over several months to monitor after a needlestick exposure.  The risk of infection is low, just important to monitor in case.

## 2018-04-13 LAB — URINE CULTURE: Culture: >=100000 — AB

## 2018-04-13 LAB — HEPATITIS PANEL, ACUTE
HCV Ab: <0.1 s/co ratio (ref 0.0–0.9)
HEP A IGM: NEGATIVE
HEP B C IGM: NEGATIVE
Hepatitis B Surface Ag: NEGATIVE

## 2018-04-13 LAB — HIV ANTIBODY (ROUTINE TESTING W REFLEX): HIV Screen 4th Generation wRfx: NONREACTIVE

## 2018-04-15 LAB — CERVICOVAGINAL ANCILLARY ONLY
BACTERIAL VAGINITIS: POSITIVE — AB
Candida vaginitis: POSITIVE — AB
Chlamydia: NEGATIVE
Neisseria Gonorrhea: NEGATIVE
Trichomonas: NEGATIVE

## 2018-04-16 ENCOUNTER — Telehealth (HOSPITAL_COMMUNITY): Payer: Self-pay | Admitting: Emergency Medicine

## 2018-04-16 MED ORDER — METRONIDAZOLE 500 MG PO TABS: 500.0000 mg | ORAL_TABLET | Freq: Two times a day (BID) | ORAL | 0 refills | Status: AC

## 2018-04-16 MED ORDER — FLUCONAZOLE 150 MG PO TABS: 150.0000 mg | ORAL_TABLET | Freq: Once | ORAL | 0 refills | Status: AC

## 2018-07-30 ENCOUNTER — Other Ambulatory Visit: Payer: Self-pay

## 2018-07-30 ENCOUNTER — Encounter (HOSPITAL_COMMUNITY): Payer: Self-pay

## 2018-07-30 ENCOUNTER — Ambulatory Visit (INDEPENDENT_AMBULATORY_CARE_PROVIDER_SITE_OTHER): Payer: Medicaid Other

## 2018-07-30 ENCOUNTER — Ambulatory Visit (HOSPITAL_COMMUNITY)
Admission: EM | Admit: 2018-07-30 | Discharge: 2018-07-30 | Disposition: A | Discharge status: Home / Self Care | Payer: Medicaid Other | Attending: Family Medicine | Admitting: Family Medicine

## 2018-07-30 DIAGNOSIS — S20212A Contusion of left front wall of thorax, initial encounter: Secondary | ICD-10-CM

## 2018-07-30 DIAGNOSIS — R0789 Other chest pain: Secondary | ICD-10-CM | POA: Diagnosis not present

## 2018-07-30 MED ORDER — IBUPROFEN 800 MG PO TABS: 800.0000 mg | ORAL_TABLET | Freq: Three times a day (TID) | ORAL | 0 refills | Status: DC

## 2018-07-30 MED ORDER — HYDROCODONE-ACETAMINOPHEN 5-325 MG PO TABS: 1.0000 | ORAL_TABLET | Freq: Four times a day (QID) | ORAL | 0 refills | Status: DC | PRN

## 2018-07-30 NOTE — ED Triage Notes (Signed)
Pt states she fell down her stairs and injured her left rib x 2 days ago.

## 2018-07-30 NOTE — Discharge Instructions (Signed)
Be aware, pain medications may cause drowsiness. Please do not drive, operate heavy machinery or make important decisions while on this medication, it can cloud your judgement.  

## 2018-07-30 NOTE — ED Provider Notes (Signed)
[facility]   [id number] 07/30/18 Arrival Time: 1346  ASSESSMENT & PLAN:  1. Rib contusion, left, initial encounter    I have personally viewed the imaging studies ordered this visit. No rib fractures appreciated. No pneumothorax.  Meds ordered this encounter  Medications  . ibuprofen (ADVIL) 800 MG tablet    Sig: Take 1 tablet (800 mg total) by mouth 3 (three) times daily with meals.    Dispense:  21 tablet    Refill:  0  . HYDROcodone-acetaminophen (NORCO/VICODIN) 5-325 MG tablet    Sig: Take 1 tablet by mouth every 6 (six) hours as needed for moderate pain or severe pain.    Dispense:  15 tablet    Refill:  0    Orders Placed This Encounter  Procedures  . DG Ribs Unilateral W/Chest Left    Follow-up Information    [facility].   Specialty:  Urgent Care Why:  As needed. Contact information: [address] [place] [address] [phone]         [place] Controlled Substances Registry consulted for this patient. I feel the risk/benefit ratio today is favorable for proceeding with this prescription for a controlled substance. Medication sedation precautions given.  Reviewed expectations re: course of current medical issues. Questions answered. Outlined signs and symptoms indicating need for more acute intervention. Patient verbalized understanding. After Visit Summary given.  SUBJECTIVE: History from: patient. Valerie Reyes is a 33 y.o. female who reports fairly persistent marked pain of her left ribs; described as dull and with occasional sharp pain; without radiation. No SOB. Onset: abrupt, 2 days ago. Injury/trama: yes, reports tripping on stairs and falling onto L anterior ribs; immediate pain. Symptoms have progressed to a point and plateaued since beginning. Aggravating factors: movement and deep breaths. Alleviating factors: rest. Associated symptoms: none reported. Extremity sensation changes  or weakness: none. Self treatment: tried OTCs without relief of pain. History of similar: no.  Past Surgical History:  Procedure Laterality Date  . CESAREAN SECTION N/A 09/01/2012   Procedure: CESAREAN SECTION;  Surgeon: Lesly Dukes, MD;  Location: [facility];  Service: Obstetrics;  Laterality: N/A;  . NO PAST SURGERIES       ROS: As per HPI. All other systems negative.   OBJECTIVE:  Vitals:   07/30/18 1409 07/30/18 1412  BP:  (!) 131/98  Pulse:  74  Resp:  16  Temp:  98.6 F (37 C)  TempSrc:  Oral  SpO2:  100%  Weight: 59.4 kg     General appearance: alert; no distress but appears to be in pain HEENT: [place]; AT Neck: supple with FROM Resp: unlabored respirations Lungs: CTAB Chest wall: very tender over L lower ribs Extremities: normal ROM of all extremities Skin: warm and dry; no visible rashes Neurologic: gait normal; normal reflexes of RUE and LUE; normal sensation of RUE and LUE; normal strength of RUE and LUE Psychological: alert and cooperative; normal mood and affect  Imaging: Dg Ribs Unilateral W/chest Left  Result Date: 07/30/2018 CLINICAL DATA:  Fall several days ago with left-sided chest pain, initial encounter EXAM: LEFT RIBS AND CHEST - 3+ VIEW COMPARISON:  07/21/2016 FINDINGS: Cardiac shadow is within normal limits. The lungs are well aerated bilaterally. No focal infiltrate or sizable effusion is seen. No acute fracture is identified. IMPRESSION: No acute rib fracture noted. Electronically Signed   By: Alcide Clever M.D.   On: 07/30/2018 15:06    No Known Allergies  Past Medical History:  Diagnosis Date  . Bronchitis   . History of kidney infection   . Medical history non-contributory   . Pregnancy   . Trichomonosis    Social History   Socioeconomic History  . Marital status: Single    Spouse name: Not on file  . Number of children: Not on file  . Years of education: Not on file  . Highest education level: Not on file  Occupational History  . Not  on file  Social Needs  . Financial resource strain: Not on file  . Food insecurity:    Worry: Not on file    Inability: Not on file  . Transportation needs:    Medical: Not on file    Non-medical: Not on file  Tobacco Use  . Smoking status: Current Every Day Smoker    Packs/day: 1.00    Types: Cigarettes    Last attempt to quit: 04/09/2012    Years since quitting: 6.3  . Smokeless tobacco: Never Used  Substance and Sexual Activity  . Alcohol use: No  . Drug use: Yes    Types: Marijuana, Cocaine    Comment: cocaine "occassional"  . Sexual activity: Yes    Birth control/protection: Injection  Lifestyle  . Physical activity:    Days per week: Not on file    Minutes per session: Not on file  . Stress: Not on file  Relationships  . Social connections:    Talks on phone: Not on file    Gets together: Not on file    Attends religious service: Not on file    Active member of club or organization: Not on file    Attends meetings of clubs or organizations: Not on file    Relationship status: Not on file  Other Topics Concern  . Not on file  Social History Narrative  . Not on file   Family History  Problem Relation Age of Onset  . Cancer Mother   . Diabetes Mother   . Hypertension Mother   . Cancer Maternal Aunt        lung  . Diabetes Maternal Grandmother   . Stroke Maternal Grandmother   . Hypertension Maternal Grandmother   . Hypertension Paternal Grandmother   . Diabetes Paternal Grandmother    Past Surgical History:  Procedure Laterality Date  . CESAREAN SECTION N/A 09/01/2012   Procedure: CESAREAN SECTION;  Surgeon: Lesly DukesKelly H Leggett, MD;  Location: [facility];  Service: Obstetrics;  Laterality: N/A;  . NO PAST SURGERIES        Mardella LaymanHagler, Genesis Paget, MD 07/31/18 [phone]

## 2018-11-30 ENCOUNTER — Emergency Department (HOSPITAL_COMMUNITY): Payer: Medicaid Other

## 2018-11-30 ENCOUNTER — Emergency Department (HOSPITAL_COMMUNITY)
Admission: EM | Admit: 2018-11-30 | Discharge: 2018-11-30 | Disposition: A | Discharge status: Home / Self Care | Payer: Medicaid Other | Attending: Emergency Medicine | Admitting: Emergency Medicine

## 2018-11-30 ENCOUNTER — Encounter (HOSPITAL_COMMUNITY): Payer: Self-pay

## 2018-11-30 ENCOUNTER — Other Ambulatory Visit: Payer: Self-pay

## 2018-11-30 DIAGNOSIS — S2232XA Fracture of one rib, left side, initial encounter for closed fracture: Secondary | ICD-10-CM | POA: Diagnosis not present

## 2018-11-30 DIAGNOSIS — S0181XA Laceration without foreign body of other part of head, initial encounter: Secondary | ICD-10-CM

## 2018-11-30 DIAGNOSIS — Z23 Encounter for immunization: Secondary | ICD-10-CM | POA: Insufficient documentation

## 2018-11-30 DIAGNOSIS — Y929 Unspecified place or not applicable: Secondary | ICD-10-CM | POA: Insufficient documentation

## 2018-11-30 DIAGNOSIS — R109 Unspecified abdominal pain: Secondary | ICD-10-CM | POA: Diagnosis not present

## 2018-11-30 DIAGNOSIS — S3991XA Unspecified injury of abdomen, initial encounter: Secondary | ICD-10-CM | POA: Diagnosis not present

## 2018-11-30 DIAGNOSIS — S0083XA Contusion of other part of head, initial encounter: Secondary | ICD-10-CM

## 2018-11-30 DIAGNOSIS — Y939 Activity, unspecified: Secondary | ICD-10-CM | POA: Insufficient documentation

## 2018-11-30 DIAGNOSIS — F17210 Nicotine dependence, cigarettes, uncomplicated: Secondary | ICD-10-CM | POA: Insufficient documentation

## 2018-11-30 DIAGNOSIS — S01112A Laceration without foreign body of left eyelid and periocular area, initial encounter: Secondary | ICD-10-CM | POA: Diagnosis not present

## 2018-11-30 DIAGNOSIS — Y999 Unspecified external cause status: Secondary | ICD-10-CM | POA: Diagnosis not present

## 2018-11-30 DIAGNOSIS — R52 Pain, unspecified: Secondary | ICD-10-CM

## 2018-11-30 DIAGNOSIS — R0602 Shortness of breath: Secondary | ICD-10-CM | POA: Insufficient documentation

## 2018-11-30 DIAGNOSIS — S0990XA Unspecified injury of head, initial encounter: Secondary | ICD-10-CM | POA: Diagnosis present

## 2018-11-30 LAB — BASIC METABOLIC PANEL
Anion gap: 9 (ref 5–15)
BUN: 14 mg/dL (ref 6–20)
CO2: 21 mmol/L — ABNORMAL LOW (ref 22–32)
Calcium: 8.9 mg/dL (ref 8.9–10.3)
Chloride: 108 mmol/L (ref 98–111)
Creatinine, Ser: 0.79 mg/dL (ref 0.44–1.00)
GFR calc Af Amer: >60 mL/min (ref >60)
GFR calc non Af Amer: >60 mL/min (ref >60)
Glucose, Bld: 107 mg/dL — ABNORMAL HIGH (ref 70–99)
Potassium: 3.7 mmol/L (ref 3.5–5.1)
Sodium: 138 mmol/L (ref 135–145)

## 2018-11-30 LAB — CBC WITH DIFFERENTIAL/PLATELET
Abs Immature Granulocytes: 0.12 10*3/uL — ABNORMAL HIGH (ref 0.00–0.07)
Band Neutrophils: 0 %
Basophils Absolute: 0 10*3/uL (ref 0.0–0.1)
Basophils Relative: 0 %
Blasts: 0 %
Eosinophils Absolute: 0.1 10*3/uL (ref 0.0–0.5)
Eosinophils Relative: 1 %
HCT: 41 % (ref 36.0–46.0)
Hemoglobin: 13.2 g/dL (ref 12.0–15.0)
Lymphocytes Relative: 21 %
Lymphs Abs: 2.6 10*3/uL (ref 0.7–4.0)
MCH: 30 pg (ref 26.0–34.0)
MCHC: 32.2 g/dL (ref 30.0–36.0)
MCV: 93.2 fL (ref 80.0–100.0)
Metamyelocytes Relative: 0 %
Monocytes Absolute: 1.1 10*3/uL — ABNORMAL HIGH (ref 0.1–1.0)
Monocytes Relative: 9 %
Myelocytes: 0 %
Neutro Abs: 8.3 10*3/uL — ABNORMAL HIGH (ref 1.7–7.7)
Neutrophils Relative %: 68 %
Other: 0 %
Platelets: 181 10*3/uL (ref 150–400)
Promyelocytes Relative: 1 %
RBC: 4.4 MIL/uL (ref 3.87–5.11)
RDW: 12.7 % (ref 11.5–15.5)
WBC: 12.2 10*3/uL — ABNORMAL HIGH (ref 4.0–10.5)
nRBC: 0 % (ref 0.0–0.2)
nRBC: 0 /100 WBC

## 2018-11-30 LAB — URINALYSIS, ROUTINE W REFLEX MICROSCOPIC
Bilirubin Urine: NEGATIVE
Glucose, UA: NEGATIVE mg/dL
Ketones, ur: 5 mg/dL — AB
Leukocytes,Ua: NEGATIVE
Nitrite: POSITIVE — AB
Protein, ur: NEGATIVE mg/dL
Specific Gravity, Urine: >1.046 — ABNORMAL HIGH (ref 1.005–1.030)
pH: 6 (ref 5.0–8.0)

## 2018-11-30 LAB — I-STAT BETA HCG BLOOD, ED (MC, WL, AP ONLY): I-stat hCG, quantitative: <5 m[IU]/mL (ref <5)

## 2018-11-30 MED ORDER — TETRACAINE HCL 0.5 % OP SOLN
2.0000 [drp] | Freq: Once | OPHTHALMIC | Status: AC
  Administered 2018-11-30: 2 [drp] via OPHTHALMIC
  Filled 2018-11-30: qty 4

## 2018-11-30 MED ORDER — HYDROCODONE-ACETAMINOPHEN 5-325 MG PO TABS: 1.0000 | ORAL_TABLET | Freq: Four times a day (QID) | ORAL | 0 refills | Status: DC | PRN

## 2018-11-30 MED ORDER — HYDROCODONE-ACETAMINOPHEN 5-325 MG PO TABS
2.0000 | ORAL_TABLET | Freq: Once | ORAL | Status: AC
  Administered 2018-11-30: 2 via ORAL
  Filled 2018-11-30: qty 2

## 2018-11-30 MED ORDER — TETANUS-DIPHTH-ACELL PERTUSSIS 5-2.5-18.5 LF-MCG/0.5 IM SUSP
0.5000 mL | Freq: Once | INTRAMUSCULAR | Status: AC
  Administered 2018-11-30: 12:00:00 0.5 mL via INTRAMUSCULAR
  Filled 2018-11-30: qty 0.5

## 2018-11-30 MED ORDER — SODIUM CHLORIDE 0.9 % IV BOLUS
500.0000 mL | Freq: Once | INTRAVENOUS | Status: AC
  Administered 2018-11-30: 500 mL via INTRAVENOUS

## 2018-11-30 MED ORDER — SODIUM CHLORIDE (PF) 0.9 % IJ SOLN
INTRAMUSCULAR | Status: AC
  Filled 2018-11-30: qty 50

## 2018-11-30 MED ORDER — IOHEXOL 300 MG/ML  SOLN
100.0000 mL | Freq: Once | INTRAMUSCULAR | Status: AC | PRN
  Administered 2018-11-30: 100 mL via INTRAVENOUS

## 2018-11-30 MED ORDER — MORPHINE SULFATE (PF) 2 MG/ML IV SOLN
2.0000 mg | Freq: Once | INTRAVENOUS | Status: AC
  Administered 2018-11-30: 08:00:00 2 mg via INTRAVENOUS
  Filled 2018-11-30: qty 1

## 2018-11-30 MED ORDER — FLUORESCEIN SODIUM 1 MG OP STRP
1.0000 | ORAL_STRIP | Freq: Once | OPHTHALMIC | Status: AC
  Administered 2018-11-30: 15:00:00 1 via OPHTHALMIC
  Filled 2018-11-30: qty 1

## 2018-11-30 MED ORDER — LIDOCAINE-EPINEPHRINE 2 %-1:100000 IJ SOLN
20.0000 mL | Freq: Once | INTRAMUSCULAR | Status: AC
  Administered 2018-11-30: 09:00:00 20 mL
  Filled 2018-11-30: qty 1
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
## 2018-11-30 NOTE — ED Notes (Signed)
Pt continues to speak loudly on the phone about school and money. No respiratory distress noted.
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
## 2018-11-30 NOTE — ED Notes (Signed)
Patient transported to CT at this time. 

## 2018-11-30 NOTE — ED Triage Notes (Signed)
Pt presents with L eye swelling and a small laceration above her L eye. Pt reports abdominal pain, no distention or bruising noted. Pt refusing to sit in a chair and has stood up from the EMS stretcher to the triage chair and then sat in the floor and is accusing EMS of placing her in the floor. That was not the case. Pt is screaming at someone on the phone when this RN attempted to triage her.

## 2018-12-02 ENCOUNTER — Other Ambulatory Visit: Payer: Self-pay

## 2018-12-02 ENCOUNTER — Emergency Department (HOSPITAL_COMMUNITY)
Admission: EM | Admit: 2018-12-02 | Discharge: 2018-12-03 | Disposition: A | Discharge status: Home / Self Care | Payer: Medicaid Other | Attending: Emergency Medicine | Admitting: Emergency Medicine

## 2018-12-02 DIAGNOSIS — R0781 Pleurodynia: Secondary | ICD-10-CM | POA: Insufficient documentation

## 2018-12-02 DIAGNOSIS — F17210 Nicotine dependence, cigarettes, uncomplicated: Secondary | ICD-10-CM | POA: Insufficient documentation

## 2018-12-02 LAB — URINE CULTURE: Culture: 5000 — AB
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
## 2018-12-03 ENCOUNTER — Telehealth: Payer: Self-pay | Admitting: *Deleted

## 2018-12-03 ENCOUNTER — Encounter (HOSPITAL_COMMUNITY): Payer: Self-pay | Admitting: Emergency Medicine

## 2018-12-03 ENCOUNTER — Emergency Department (HOSPITAL_COMMUNITY): Payer: Medicaid Other

## 2018-12-03 MED ORDER — LIDOCAINE 5 % EX PTCH
2.0000 | MEDICATED_PATCH | CUTANEOUS | Status: DC
  Administered 2018-12-03: 01:00:00 2 via TRANSDERMAL
  Filled 2018-12-03: qty 2

## 2018-12-03 MED ORDER — LIDOCAINE 5 % EX PTCH: 1.0000 | MEDICATED_PATCH | CUTANEOUS | 0 refills | Status: DC

## 2018-12-03 MED ORDER — NAPROXEN 375 MG PO TABS: 375.0000 mg | ORAL_TABLET | Freq: Two times a day (BID) | ORAL | 0 refills | Status: DC

## 2018-12-03 MED ORDER — NAPROXEN 500 MG PO TABS
500.0000 mg | ORAL_TABLET | ORAL | Status: AC
  Administered 2018-12-03: 01:00:00 500 mg via ORAL
  Filled 2018-12-03: qty 1

## 2018-12-03 NOTE — ED Notes (Addendum)
Pt verbalized discharge instructions and follow up care. Alert and ambulatory. No iv. Demonstrated how to use incentive spirometer. Calling [employer] home

## 2018-12-03 NOTE — ED Notes (Signed)
XR at bedside
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
## 2018-12-03 NOTE — Telephone Encounter (Signed)
Post ED Visit - Positive Culture Follow-up  Culture report reviewed by antimicrobial stewardship pharmacist: [facility] Team []  Elenor Quinones, Pharm.D. []  Heide Guile, Pharm.D., BCPS AQ-ID []  Parks Neptune, Pharm.D., BCPS []  Alycia Rossetti, Pharm.D., BCPS []  [place], [place].D., BCPS, AAHIVP []  Legrand Como, Pharm.D., BCPS, AAHIVP []  Salome Arnt, PharmD, BCPS []  Johnnette Gourd, PharmD, BCPS []  Hughes Better, PharmD, BCPS []  Leeroy Cha, PharmD []  Laqueta Linden, PharmD, BCPS []  Albertina Parr, PharmD  [facility] Team [x]  Leodis Sias, PharmD []  Lindell Spar, PharmD []  Royetta Asal, PharmD []  Graylin Shiver, Rph []  Rema Fendt) Glennon Mac, PharmD []  Arlyn Dunning, PharmD []  Netta Cedars, PharmD []  Dia Sitter, PharmD []  Leone Haven, PharmD []  Gretta Arab, PharmD []  Theodis Shove, PharmD []  Peggyann Juba, PharmD []  Reuel Boom, PharmD   Positive urine culture Reviewed  and no further patient follow-up is required at this time.  Harlon Flor Talley 12/03/2018, 11:13 AM

## 2018-12-16 ENCOUNTER — Other Ambulatory Visit: Payer: Self-pay

## 2018-12-16 ENCOUNTER — Encounter (HOSPITAL_COMMUNITY): Payer: Self-pay

## 2018-12-16 ENCOUNTER — Ambulatory Visit (HOSPITAL_COMMUNITY)
Admission: EM | Admit: 2018-12-16 | Discharge: 2018-12-16 | Disposition: A | Discharge status: Home / Self Care | Payer: Medicaid Other | Attending: Family Medicine | Admitting: Family Medicine

## 2018-12-16 DIAGNOSIS — Z4802 Encounter for removal of sutures: Secondary | ICD-10-CM | POA: Diagnosis not present

## 2018-12-16 NOTE — ED Triage Notes (Signed)
Three sutures removed from patient's left eyebrow, wound healing appropriately.

## 2019-12-11 ENCOUNTER — Encounter (HOSPITAL_BASED_OUTPATIENT_CLINIC_OR_DEPARTMENT_OTHER): Payer: Self-pay | Admitting: *Deleted

## 2019-12-11 ENCOUNTER — Other Ambulatory Visit: Payer: Self-pay

## 2019-12-11 ENCOUNTER — Emergency Department (HOSPITAL_BASED_OUTPATIENT_CLINIC_OR_DEPARTMENT_OTHER)
Admission: EM | Admit: 2019-12-11 | Discharge: 2019-12-11 | Disposition: A | Discharge status: Home / Self Care | Payer: Medicaid Other | Attending: Emergency Medicine | Admitting: Emergency Medicine

## 2019-12-11 DIAGNOSIS — B9689 Other specified bacterial agents as the cause of diseases classified elsewhere: Secondary | ICD-10-CM | POA: Insufficient documentation

## 2019-12-11 DIAGNOSIS — F17210 Nicotine dependence, cigarettes, uncomplicated: Secondary | ICD-10-CM | POA: Insufficient documentation

## 2019-12-11 DIAGNOSIS — N760 Acute vaginitis: Secondary | ICD-10-CM | POA: Insufficient documentation

## 2019-12-11 DIAGNOSIS — M545 Low back pain, unspecified: Secondary | ICD-10-CM | POA: Diagnosis not present

## 2019-12-11 DIAGNOSIS — Z202 Contact with and (suspected) exposure to infections with a predominantly sexual mode of transmission: Secondary | ICD-10-CM | POA: Insufficient documentation

## 2019-12-11 DIAGNOSIS — R350 Frequency of micturition: Secondary | ICD-10-CM | POA: Diagnosis present

## 2019-12-11 LAB — URINALYSIS, MICROSCOPIC (REFLEX): RBC / HPF: NONE SEEN RBC/hpf (ref 0–5)

## 2019-12-11 LAB — URINALYSIS, ROUTINE W REFLEX MICROSCOPIC
Bilirubin Urine: NEGATIVE
Glucose, UA: NEGATIVE mg/dL
Hgb urine dipstick: NEGATIVE
Ketones, ur: 15 mg/dL — AB
Leukocytes,Ua: NEGATIVE
Nitrite: POSITIVE — AB
Protein, ur: NEGATIVE mg/dL
Specific Gravity, Urine: 1.02 (ref 1.005–1.030)
pH: 7 (ref 5.0–8.0)

## 2019-12-11 LAB — WET PREP, GENITAL
Sperm: NONE SEEN
Trich, Wet Prep: NONE SEEN
Yeast Wet Prep HPF POC: NONE SEEN

## 2019-12-11 LAB — PREGNANCY, URINE: Preg Test, Ur: NEGATIVE

## 2019-12-11 MED ORDER — METRONIDAZOLE 500 MG PO TABS: 500.0000 mg | ORAL_TABLET | Freq: Two times a day (BID) | ORAL | 0 refills | Status: DC

## 2019-12-11 MED ORDER — METRONIDAZOLE 500 MG PO TABS
500.0000 mg | ORAL_TABLET | Freq: Once | ORAL | Status: AC
  Administered 2019-12-11: 500 mg via ORAL
  Filled 2019-12-11: qty 1

## 2019-12-11 MED ORDER — CEFTRIAXONE SODIUM 500 MG IJ SOLR
500.0000 mg | Freq: Once | INTRAMUSCULAR | Status: AC
  Administered 2019-12-11: 500 mg via INTRAMUSCULAR
  Filled 2019-12-11: qty 500

## 2019-12-11 MED ORDER — AZITHROMYCIN 1 G PO PACK
1.0000 g | PACK | Freq: Once | ORAL | Status: AC
  Administered 2019-12-11: 1 g via ORAL
  Filled 2019-12-11: qty 1

## 2019-12-11 MED ORDER — LIDOCAINE HCL (PF) 1 % IJ SOLN
INTRAMUSCULAR | Status: AC
  Administered 2019-12-11: 5 mL
  Filled 2019-12-11: qty 5

## 2019-12-11 NOTE — Discharge Instructions (Signed)
Take Flagyl twice daily for BV   You were treated for gonorrhea chlamydia.  We sent off the test if you are positive you will be called and your partner will need to be treated.  See your doctor for follow-up.  Return to ER if you have worse abdominal pain, vaginal bleeding, fever.

## 2019-12-11 NOTE — ED Provider Notes (Signed)
[facility] EMERGENCY DEPARTMENT Provider Note   CSN: [id number] Arrival date & time: 12/11/19  2008     History Chief Complaint  Patient presents with  . Vaginal Discharge  . Back Pain    Valerie Reyes is a 34 y.o. female history of trichomonas, yet here presenting with possible STD exposure.  Patient had sex several days ago.  Since then she has some urinary frequency as well as vaginal discharge.  She was concerned that she may have STD.  She has had some back pain as well.  Denies any fevers or vomiting.   The history is provided by the patient.       Past Medical History:  Diagnosis Date  . Bronchitis   . History of kidney infection   . Medical history non-contributory   . Pregnancy   . Trichomonosis     Patient Active Problem List   Diagnosis Date Noted  . UTI (urinary tract infection) 04/28/2016  . Late prenatal care 04/23/2012    Past Surgical History:  Procedure Laterality Date  . CESAREAN SECTION N/A 09/01/2012   Procedure: CESAREAN SECTION;  Surgeon: Lesly Dukes, MD;  Location: [facility];  Service: Obstetrics;  Laterality: N/A;  . NO PAST SURGERIES       OB History    Gravida  1   Para  1   Term  1   Preterm  0   AB  0   Living  1     SAB  0   TAB  0   Ectopic  0   Multiple  0   Live Births  1           Family History  Problem Relation Age of Onset  . Cancer Mother   . Diabetes Mother   . Hypertension Mother   . Cancer Maternal Aunt        lung  . Diabetes Maternal Grandmother   . Stroke Maternal Grandmother   . Hypertension Maternal Grandmother   . Hypertension Paternal Grandmother   . Diabetes Paternal Grandmother     Social History   Tobacco Use  . Smoking status: Current Every Day Smoker    Packs/day: 1.00    Types: Cigarettes    Last attempt to quit: 04/09/2012    Years since quitting: 7.6  . Smokeless tobacco: Never Used  Substance Use Topics  . Alcohol use: No  . Drug use: Yes    Types:  Marijuana, Cocaine    Comment: cocaine "occassional"    Home Medications Prior to Admission medications   Medication Sig Start Date End Date Taking? Authorizing Provider  HYDROcodone-acetaminophen (NORCO/VICODIN) 5-325 MG tablet Take 1-2 tablets by mouth every 6 (six) hours as needed. 11/30/18   Harlene Salts A, PA-C  ibuprofen (ADVIL) 800 MG tablet Take 1 tablet (800 mg total) by mouth 3 (three) times daily with meals. Patient not taking: Reported on 11/30/2018 07/30/18   Mardella Layman, MD  lidocaine (LIDODERM) 5 % Place 1 patch onto the skin daily. Remove & Discard patch within 12 hours or as directed by MD 12/03/18   Nicanor Alcon, April, MD  metroNIDAZOLE (FLAGYL) 500 MG tablet Take 1 tablet (500 mg total) by mouth 2 (two) times daily. One po bid x 7 days 12/11/19   Charlynne Pander, MD  naproxen (NAPROSYN) 375 MG tablet Take 1 tablet (375 mg total) by mouth 2 (two) times daily with a meal. 12/03/18   Palumbo, April, MD    Allergies  Patient has no known allergies.  Review of Systems   Review of Systems  Genitourinary: Positive for vaginal discharge.  Musculoskeletal: Positive for back pain.  All other systems reviewed and are negative.   Physical Exam Updated Vital Signs BP (!) 132/93   Pulse 68   Temp 98.5 F (36.9 C) (Oral)   Resp 18   Ht 5\' 1"  (1.549 m)   Wt 62.1 kg   SpO2 100%   BMI 25.87 kg/m   Physical Exam Vitals and nursing note reviewed.  HENT:     Head: Normocephalic.     Mouth/Throat:     Mouth: Mucous membranes are moist.  Eyes:     Extraocular Movements: Extraocular movements intact.     Pupils: Pupils are equal, round, and reactive to light.  Cardiovascular:     Rate and Rhythm: Normal rate and regular rhythm.     Pulses: Normal pulses.     Heart sounds: Normal heart sounds.  Pulmonary:     Effort: Pulmonary effort is normal.     Breath sounds: Normal breath sounds.  Abdominal:     General: Abdomen is flat.     Palpations: Abdomen is soft.      Comments: No abdominal tenderness and no CVA tenderness.  Musculoskeletal:        General: Normal range of motion.     Cervical back: Normal range of motion.  Skin:    General: Skin is warm.     Capillary Refill: Capillary refill takes less than 2 seconds.  Neurological:     General: No focal deficit present.     Mental Status: She is alert and oriented to person, place, and time.  Psychiatric:        Mood and Affect: Mood normal.        Behavior: Behavior normal.     ED Results / Procedures / Treatments   Labs (all labs ordered are listed, but only abnormal results are displayed) Labs Reviewed  WET PREP, GENITAL - Abnormal; Notable for the following components:      Result Value   Clue Cells Wet Prep HPF POC PRESENT (*)    WBC, Wet Prep HPF POC FEW (*)    All other components within normal limits  URINALYSIS, ROUTINE W REFLEX MICROSCOPIC - Abnormal; Notable for the following components:   Ketones, ur 15 (*)    Nitrite POSITIVE (*)    All other components within normal limits  URINALYSIS, MICROSCOPIC (REFLEX) - Abnormal; Notable for the following components:   Bacteria, UA MANY (*)    All other components within normal limits  PREGNANCY, URINE  GC/CHLAMYDIA PROBE AMP ([place]) NOT AT [facility]    EKG None  Radiology No results found.  Procedures Procedures (including critical care time)  Medications Ordered in ED Medications  cefTRIAXone (ROCEPHIN) injection 500 mg (500 mg Intramuscular Given 12/11/19 2225)  azithromycin (ZITHROMAX) powder 1 g (1 g Oral Given 12/11/19 2225)  lidocaine (PF) (XYLOCAINE) 1 % injection (5 mLs  Given 12/11/19 2226)  metroNIDAZOLE (FLAGYL) tablet 500 mg (500 mg Oral Given 12/11/19 2318)    ED Course  I have reviewed the triage vital signs and the nursing notes.  Pertinent labs & imaging results that were available during my care of the patient were reviewed by me and considered in my medical decision making (see chart for  details).    MDM Rules/Calculators/A&P  Valerie Reyes is a 34 y.o. female here presenting with possible STD exposure.  Patient also has some urinary frequency as well.  Her UA did not show an obvious UTI.  She was able to self swab and wet prep is positive for BV.  She was given Flagyl.  Also empirically treated for gonorrhea chlamydia.  Recommend safe sex practices.  If her GC chlamydia is positive her partner will need treatment as well.  Final Clinical Impression(s) / ED Diagnoses Final diagnoses:  BV (bacterial vaginosis)  Possible exposure to STD    Rx / DC Orders ED Discharge Orders         Ordered    metroNIDAZOLE (FLAGYL) 500 MG tablet  2 times daily        12/11/19 2321           Charlynne Pander, MD 12/11/19 2325

## 2019-12-11 NOTE — ED Triage Notes (Signed)
Vaginal discharge, back pain and dysuria x 3 days.

## 2019-12-15 LAB — GC/CHLAMYDIA PROBE AMP (~~LOC~~) NOT AT ARMC
Chlamydia: POSITIVE — AB
Comment: NEGATIVE
Comment: NORMAL
Neisseria Gonorrhea: POSITIVE — AB

## 2022-02-22 ENCOUNTER — Ambulatory Visit
Admission: EM | Admit: 2022-02-22 | Discharge: 2022-02-22 | Disposition: A | Discharge status: Home / Self Care | Payer: Medicaid Other | Attending: Internal Medicine | Admitting: Internal Medicine

## 2022-02-22 DIAGNOSIS — N3000 Acute cystitis without hematuria: Secondary | ICD-10-CM | POA: Diagnosis present

## 2022-02-22 DIAGNOSIS — F17200 Nicotine dependence, unspecified, uncomplicated: Secondary | ICD-10-CM | POA: Insufficient documentation

## 2022-02-22 DIAGNOSIS — J209 Acute bronchitis, unspecified: Secondary | ICD-10-CM | POA: Insufficient documentation

## 2022-02-22 DIAGNOSIS — Z7251 High risk heterosexual behavior: Secondary | ICD-10-CM | POA: Insufficient documentation

## 2022-02-22 LAB — POCT URINALYSIS DIP (MANUAL ENTRY)
Bilirubin, UA: NEGATIVE
Blood, UA: NEGATIVE
Glucose, UA: NEGATIVE mg/dL
Ketones, POC UA: NEGATIVE mg/dL
Leukocytes, UA: NEGATIVE
Nitrite, UA: POSITIVE — AB
Spec Grav, UA: 1.02 (ref 1.010–1.025)
Urobilinogen, UA: 0.2 E.U./dL
pH, UA: 6.5 (ref 5.0–8.0)

## 2022-02-22 LAB — POCT URINE PREGNANCY: Preg Test, Ur: NEGATIVE

## 2022-02-22 MED ORDER — CIPROFLOXACIN HCL 500 MG PO TABS: 500.0000 mg | ORAL_TABLET | Freq: Two times a day (BID) | ORAL | 0 refills | Status: DC

## 2022-02-22 MED ORDER — PREDNISONE 20 MG PO TABS: ORAL_TABLET | ORAL | 0 refills | Status: DC

## 2022-02-22 MED ORDER — PROMETHAZINE-DM 6.25-15 MG/5ML PO SYRP: 5.0000 mL | ORAL_SOLUTION | Freq: Three times a day (TID) | ORAL | 0 refills | Status: DC | PRN

## 2022-02-22 MED ORDER — CETIRIZINE HCL 10 MG PO TABS: 10.0000 mg | ORAL_TABLET | Freq: Every day | ORAL | 0 refills | Status: DC

## 2022-02-22 MED ORDER — ALBUTEROL SULFATE HFA 108 (90 BASE) MCG/ACT IN AERS: 1.0000 | INHALATION_SPRAY | Freq: Four times a day (QID) | RESPIRATORY_TRACT | 0 refills | Status: DC | PRN

## 2022-02-22 MED ORDER — BENZONATATE 100 MG PO CAPS: 100.0000 mg | ORAL_CAPSULE | Freq: Three times a day (TID) | ORAL | 0 refills | Status: DC | PRN

## 2022-02-22 MED ORDER — FLUCONAZOLE 150 MG PO TABS: 150.0000 mg | ORAL_TABLET | ORAL | 0 refills | Status: DC

## 2022-02-22 NOTE — Discharge Instructions (Addendum)
Please start ciprofloxacin to address an urinary tract infection. Make sure you hydrate very well with plain water and a quantity of 64 ounces of water a day.  Please limit drinks that are considered urinary irritants such as soda, sweet tea, coffee, energy drinks, alcohol.  These can worsen your urinary and genital symptoms but also be the source of them.  I will let you know about your urine culture and vaginal swab results through MyChart to see if we need to prescribe or change your antibiotics based off of those results.

## 2022-02-22 NOTE — ED Triage Notes (Signed)
Pt c/o cough, URI sx x 5 days-also left flank pain, urinnary freq started 2 days ago-NAD-steady gait

## 2022-02-22 NOTE — ED Provider Notes (Signed)
[facility]RG[facility]  Note:  This document was prepared using Conservation officer, historic buildings and may include unintentional dictation errors.  MRN: [id number] DOB: 1985-09-11  Subjective:   Valerie Reyes is a 35 y.o. female presenting for 5 day history of runny stuffy nose, fever, coughing that elicits chest pain, shob, wheezing, body aches. She is a smoker.  LMP was ~3 months ago but is unsure. Has had 2 days of urinary frequency, left flank pain, dysuria. Drinks 2 bottles of water daily. Drinks juice and soda as well.  No hematuria, vaginal discharge. Would like to know about STIs. Has unprotected sex with 1 female partner.  Has remote history of BV infection.  She typically gets yeast infections very easily however.  No current facility-administered medications for this encounter.  Current Outpatient Medications:    HYDROcodone-acetaminophen (NORCO/VICODIN) 5-325 MG tablet, Take 1-2 tablets by mouth every 6 (six) hours as needed., Disp: 6 tablet, Rfl: 0   ibuprofen (ADVIL) 800 MG tablet, Take 1 tablet (800 mg total) by mouth 3 (three) times daily with meals. (Patient not taking: Reported on 11/30/2018), Disp: 21 tablet, Rfl: 0   lidocaine (LIDODERM) 5 %, Place 1 patch onto the skin daily. Remove & Discard patch within 12 hours or as directed by MD, Disp: 30 patch, Rfl: 0   metroNIDAZOLE (FLAGYL) 500 MG tablet, Take 1 tablet (500 mg total) by mouth 2 (two) times daily. One po bid x 7 days, Disp: 14 tablet, Rfl: 0   naproxen (NAPROSYN) 375 MG tablet, Take 1 tablet (375 mg total) by mouth 2 (two) times daily with a meal., Disp: 20 tablet, Rfl: 0   No Known Allergies  Past Medical History:  Diagnosis Date   Bronchitis    History of kidney infection    Medical history non-contributory    Pregnancy    Trichomonosis      Past Surgical History:  Procedure Laterality Date   CESAREAN SECTION N/A 09/01/2012   Procedure: CESAREAN SECTION;  Surgeon: Lesly Dukes, MD;   Location: [facility];  Service: Obstetrics;  Laterality: N/A;   NO PAST SURGERIES      Family History  Problem Relation Age of Onset   Cancer Mother    Diabetes Mother    Hypertension Mother    Cancer Maternal Aunt        lung   Diabetes Maternal Grandmother    Stroke Maternal Grandmother    Hypertension Maternal Grandmother    Hypertension Paternal Grandmother    Diabetes Paternal Grandmother     Social History   Tobacco Use   Smoking status: Every Day    Packs/day: 1.00    Types: Cigarettes   Smokeless tobacco: Never  Vaping Use   Vaping Use: Never used  Substance Use Topics   Alcohol use: No   Drug use: Not Currently    ROS   Objective:   Vitals: BP 125/85 (BP Location: Right Arm)   Pulse 80   Temp 98.4 F (36.9 C)   Resp 18   SpO2 93%   Physical Exam Constitutional:      General: She is not in acute distress.    Appearance: Normal appearance. She is well-developed and normal weight. She is not ill-appearing, toxic-appearing or diaphoretic.  HENT:     Head: Normocephalic and atraumatic.     Right Ear: Tympanic membrane, ear canal and external ear normal. No drainage or tenderness. No middle ear effusion. There is no impacted cerumen. Tympanic membrane  is not erythematous or bulging.     Left Ear: Tympanic membrane, ear canal and external ear normal. No drainage or tenderness.  No middle ear effusion. There is no impacted cerumen. Tympanic membrane is not erythematous or bulging.     Nose: Congestion present. No rhinorrhea.     Mouth/Throat:     Mouth: Mucous membranes are moist. No oral lesions.     Pharynx: No pharyngeal swelling, oropharyngeal exudate, posterior oropharyngeal erythema or uvula swelling.     Tonsils: No tonsillar exudate or tonsillar abscesses.  Eyes:     General: No scleral icterus.       Right eye: No discharge.        Left eye: No discharge.     Extraocular Movements: Extraocular movements intact.     Right eye: Normal extraocular  motion.     Left eye: Normal extraocular motion.     Conjunctiva/sclera: Conjunctivae normal.  Cardiovascular:     Rate and Rhythm: Normal rate and regular rhythm.     Heart sounds: Normal heart sounds. No murmur heard.    No friction rub. No gallop.  Pulmonary:     Effort: Pulmonary effort is normal. No respiratory distress.     Breath sounds: No stridor. No wheezing, rhonchi or rales.  Chest:     Chest wall: No tenderness.  Abdominal:     General: Bowel sounds are normal. There is no distension.     Palpations: Abdomen is soft. There is no mass.     Tenderness: There is abdominal tenderness in the suprapubic area. There is right CVA tenderness and left CVA tenderness. There is no guarding or rebound.     Comments: Mild bilateral flank pain and low back pain.  Musculoskeletal:     Cervical back: Normal range of motion and neck supple.  Lymphadenopathy:     Cervical: No cervical adenopathy.  Skin:    General: Skin is warm and dry.  Neurological:     General: No focal deficit present.     Mental Status: She is alert and oriented to person, place, and time.  Psychiatric:        Mood and Affect: Mood normal.        Behavior: Behavior normal.        Thought Content: Thought content normal.        Judgment: Judgment normal.     Results for orders placed or performed during the hospital encounter of 02/22/22 (from the past 24 hour(s))  POCT urinalysis dipstick     Status: Abnormal   Collection Time: 02/22/22 10:16 AM  Result Value Ref Range   Color, UA yellow yellow   Clarity, UA cloudy (A) clear   Glucose, UA negative negative mg/dL   Bilirubin, UA negative negative   Ketones, POC UA negative negative mg/dL   Spec Grav, UA 6.789 3.810 - 1.025   Blood, UA negative negative   pH, UA 6.5 5.0 - 8.0   Protein Ur, POC trace (A) negative mg/dL   Urobilinogen, UA 0.2 0.2 or 1.0 E.U./dL   Nitrite, UA Positive (A) Negative   Leukocytes, UA Negative Negative  POCT urine pregnancy      Status: None   Collection Time: 02/22/22 10:16 AM  Result Value Ref Range   Preg Test, Ur Negative Negative    Assessment and Plan :   PDMP not reviewed this encounter.  1. Acute cystitis without hematuria   2. Acute bronchitis, unspecified organism   3. Smoker  4. Unprotected sex     Recommended managing her acute bronchitis with albuterol, supportive care, prednisone.  Deferred COVID testing given timeline of illness.  Recommended she avoid smoking at all cost. Deferred imaging given clear cardiopulmonary exam, hemodynamically stable vital signs. Start ciprofloxacin to cover for acute cystitis, urine culture pending.  Recommended aggressive hydration, limiting urinary irritants.  Low suspicion for pyelonephritis but given that she had CVA tenderness bilaterally technically on her exam will use Cipro as above.  Use fluconazole for yeast infection secondary to antibiotics and steroid use.  Vaginal swab results pending.  Counseled patient on potential for adverse effects with medications prescribed/recommended today, ER and return-to-clinic precautions discussed, patient verbalized understanding.    Wallis Bamberg, [place] 02/22/22 1111

## 2022-02-23 ENCOUNTER — Ambulatory Visit: Payer: Self-pay

## 2022-02-23 LAB — CERVICOVAGINAL ANCILLARY ONLY
Bacterial Vaginitis (gardnerella): POSITIVE — AB
Chlamydia: NEGATIVE
Comment: NEGATIVE
Comment: NEGATIVE
Comment: NEGATIVE
Comment: NORMAL
Neisseria Gonorrhea: NEGATIVE
Trichomonas: NEGATIVE

## 2022-02-24 ENCOUNTER — Telehealth (HOSPITAL_COMMUNITY): Payer: Self-pay | Admitting: Emergency Medicine

## 2022-02-24 LAB — URINE CULTURE: Culture: >=100000 — AB

## 2022-02-24 MED ORDER — METRONIDAZOLE 500 MG PO TABS: 500.0000 mg | ORAL_TABLET | Freq: Two times a day (BID) | ORAL | 0 refills | Status: DC

## 2022-02-24 MED ORDER — NITROFURANTOIN MONOHYD MACRO 100 MG PO CAPS: 100.0000 mg | ORAL_CAPSULE | Freq: Two times a day (BID) | ORAL | 0 refills | Status: DC

## 2022-06-04 ENCOUNTER — Other Ambulatory Visit: Payer: Self-pay

## 2022-06-04 ENCOUNTER — Emergency Department (HOSPITAL_COMMUNITY): Payer: Medicaid Other

## 2022-06-04 ENCOUNTER — Encounter (HOSPITAL_COMMUNITY): Payer: Self-pay

## 2022-06-04 ENCOUNTER — Emergency Department (HOSPITAL_COMMUNITY)
Admission: EM | Admit: 2022-06-04 | Discharge: 2022-06-04 | Disposition: A | Discharge status: Home / Self Care | Payer: Medicaid Other | Attending: Emergency Medicine | Admitting: Emergency Medicine

## 2022-06-04 DIAGNOSIS — R1032 Left lower quadrant pain: Secondary | ICD-10-CM | POA: Diagnosis present

## 2022-06-04 DIAGNOSIS — R102 Pelvic and perineal pain: Secondary | ICD-10-CM | POA: Diagnosis not present

## 2022-06-04 DIAGNOSIS — A5909 Other urogenital trichomoniasis: Secondary | ICD-10-CM | POA: Insufficient documentation

## 2022-06-04 DIAGNOSIS — N83202 Unspecified ovarian cyst, left side: Secondary | ICD-10-CM | POA: Insufficient documentation

## 2022-06-04 LAB — COMPREHENSIVE METABOLIC PANEL
ALT: 14 U/L (ref 0–44)
AST: 16 U/L (ref 15–41)
Albumin: 4.3 g/dL (ref 3.5–5.0)
Alkaline Phosphatase: 63 U/L (ref 38–126)
Anion gap: 9 (ref 5–15)
BUN: 8 mg/dL (ref 6–20)
CO2: 22 mmol/L (ref 22–32)
Calcium: 9.3 mg/dL (ref 8.9–10.3)
Chloride: 102 mmol/L (ref 98–111)
Creatinine, Ser: 0.69 mg/dL (ref 0.44–1.00)
GFR, Estimated: >60 mL/min (ref >60)
Glucose, Bld: 119 mg/dL — ABNORMAL HIGH (ref 70–99)
Potassium: 3.4 mmol/L — ABNORMAL LOW (ref 3.5–5.1)
Sodium: 133 mmol/L — ABNORMAL LOW (ref 135–145)
Total Bilirubin: 1.5 mg/dL — ABNORMAL HIGH (ref 0.3–1.2)
Total Protein: 7.9 g/dL (ref 6.5–8.1)

## 2022-06-04 LAB — I-STAT BETA HCG BLOOD, ED (MC, WL, AP ONLY): I-stat hCG, quantitative: <5 m[IU]/mL (ref <5)

## 2022-06-04 LAB — WET PREP, GENITAL
Sperm: NONE SEEN
WBC, Wet Prep HPF POC: >=10 — AB (ref <10)
Yeast Wet Prep HPF POC: NONE SEEN

## 2022-06-04 LAB — CBC
HCT: 44.4 % (ref 36.0–46.0)
Hemoglobin: 15.1 g/dL — ABNORMAL HIGH (ref 12.0–15.0)
MCH: 30 pg (ref 26.0–34.0)
MCHC: 34 g/dL (ref 30.0–36.0)
MCV: 88.1 fL (ref 80.0–100.0)
Platelets: 215 10*3/uL (ref 150–400)
RBC: 5.04 MIL/uL (ref 3.87–5.11)
RDW: 12.8 % (ref 11.5–15.5)
WBC: 17 10*3/uL — ABNORMAL HIGH (ref 4.0–10.5)
nRBC: 0 % (ref 0.0–0.2)

## 2022-06-04 LAB — URINALYSIS, ROUTINE W REFLEX MICROSCOPIC
Bilirubin Urine: NEGATIVE
Glucose, UA: NEGATIVE mg/dL
Ketones, ur: 80 mg/dL — AB
Nitrite: POSITIVE — AB
Protein, ur: NEGATIVE mg/dL
Specific Gravity, Urine: 1.012 (ref 1.005–1.030)
pH: 5 (ref 5.0–8.0)

## 2022-06-04 LAB — LIPASE, BLOOD: Lipase: 22 U/L (ref 11–51)

## 2022-06-04 LAB — RPR: RPR Ser Ql: NONREACTIVE

## 2022-06-04 LAB — HIV ANTIBODY (ROUTINE TESTING W REFLEX): HIV Screen 4th Generation wRfx: NONREACTIVE

## 2022-06-04 MED ORDER — MORPHINE SULFATE (PF) 4 MG/ML IV SOLN
4.0000 mg | Freq: Once | INTRAVENOUS | Status: AC
  Administered 2022-06-04: 4 mg via INTRAVENOUS
  Filled 2022-06-04: qty 1

## 2022-06-04 MED ORDER — METRONIDAZOLE 500 MG PO TABS: 500.0000 mg | ORAL_TABLET | Freq: Two times a day (BID) | ORAL | 0 refills | Status: DC

## 2022-06-04 MED ORDER — ONDANSETRON HCL 4 MG PO TABS: 4.0000 mg | ORAL_TABLET | Freq: Three times a day (TID) | ORAL | 0 refills | Status: DC | PRN

## 2022-06-04 MED ORDER — LACTATED RINGERS IV BOLUS
1000.0000 mL | Freq: Once | INTRAVENOUS | Status: AC
  Administered 2022-06-04: 1000 mL via INTRAVENOUS

## 2022-06-04 MED ORDER — SODIUM CHLORIDE 0.9 % IV SOLN
1.0000 g | Freq: Once | INTRAVENOUS | Status: AC
  Administered 2022-06-04: 1 g via INTRAVENOUS
  Filled 2022-06-04: qty 10

## 2022-06-04 MED ORDER — DOXYCYCLINE HYCLATE 100 MG PO TABS
100.0000 mg | ORAL_TABLET | Freq: Once | ORAL | Status: AC
  Administered 2022-06-04: 100 mg via ORAL
  Filled 2022-06-04: qty 1

## 2022-06-04 MED ORDER — DOXYCYCLINE HYCLATE 100 MG PO CAPS: 100.0000 mg | ORAL_CAPSULE | Freq: Two times a day (BID) | ORAL | 0 refills | Status: DC

## 2022-06-04 MED ORDER — ONDANSETRON HCL 4 MG/2ML IJ SOLN
4.0000 mg | Freq: Once | INTRAMUSCULAR | Status: AC
  Administered 2022-06-04: 4 mg via INTRAVENOUS
  Filled 2022-06-04: qty 2

## 2022-06-04 MED ORDER — METRONIDAZOLE 500 MG PO TABS
500.0000 mg | ORAL_TABLET | Freq: Once | ORAL | Status: AC
  Administered 2022-06-04: 500 mg via ORAL
  Filled 2022-06-04: qty 1

## 2022-06-04 MED ORDER — HYDROCODONE-ACETAMINOPHEN 5-325 MG PO TABS: 1.0000 | ORAL_TABLET | Freq: Four times a day (QID) | ORAL | 0 refills | Status: DC | PRN

## 2022-06-04 MED ORDER — IOHEXOL 300 MG/ML  SOLN
100.0000 mL | Freq: Once | INTRAMUSCULAR | Status: AC | PRN
  Administered 2022-06-04: 100 mL via INTRAVENOUS

## 2022-06-04 MED ORDER — KETOROLAC TROMETHAMINE 15 MG/ML IJ SOLN
15.0000 mg | Freq: Once | INTRAMUSCULAR | Status: AC
  Administered 2022-06-04: 15 mg via INTRAVENOUS
  Filled 2022-06-04: qty 1

## 2022-06-04 NOTE — ED Provider Notes (Signed)
Valerie Reyes EMERGENCY DEPARTMENT AT [facility] Provider Note   CSN: [id number] Arrival date & time: 06/04/22  4098     History  Chief Complaint  Patient presents with   Abdominal Pain    Valerie Reyes is a 37 y.o. female.  The history is provided by the patient and medical records.  Abdominal Pain Valerie Reyes is a 37 y.o. female who presents to the Emergency Department complaining of abdominal pain.  She presents emergency department for evaluation of stabbing left lower quadrant abdominal pain that started Friday night.  She has associated nausea.  Possible fever but she did not check her temperature at home.  She has a little bit of vomiting.  No diarrhea.  No dysuria but she does have pressure with urination.  No vaginal discharge.  LMP was in January.  She does frequently have irregular cycles.  She has no known medical problems and takes no medications.  She does have a history of prior C-section.  No prior similar symptoms.     Home Medications Prior to Admission medications   Medication Sig Start Date End Date Taking? Authorizing Provider  doxycycline (VIBRAMYCIN) 100 MG capsule Take 1 capsule (100 mg total) by mouth 2 (two) times daily. 06/04/22  Yes Tilden Fossa, MD  HYDROcodone-acetaminophen (NORCO/VICODIN) 5-325 MG tablet Take 1 tablet by mouth every 6 (six) hours as needed. 06/04/22  Yes Tilden Fossa, MD  metroNIDAZOLE (FLAGYL) 500 MG tablet Take 1 tablet (500 mg total) by mouth 2 (two) times daily. 06/04/22  Yes Tilden Fossa, MD  ondansetron (ZOFRAN) 4 MG tablet Take 1 tablet (4 mg total) by mouth every 8 (eight) hours as needed for nausea or vomiting. 06/04/22  Yes Tilden Fossa, MD  albuterol (VENTOLIN HFA) 108 (90 Base) MCG/ACT inhaler Inhale 1-2 puffs into the lungs every 6 (six) hours as needed for wheezing or shortness of breath. 02/22/22   Wallis Bamberg, PA-C  benzonatate (TESSALON) 100 MG capsule Take 1 capsule (100 mg total) by mouth 3 (three)  times daily as needed for cough. 02/22/22   Wallis Bamberg, PA-C  cetirizine (ZYRTEC ALLERGY) 10 MG tablet Take 1 tablet (10 mg total) by mouth daily. 02/22/22   Wallis Bamberg, PA-C  fluconazole (DIFLUCAN) 150 MG tablet Take 1 tablet (150 mg total) by mouth once a week. 02/22/22   Wallis Bamberg, PA-C  ibuprofen (ADVIL) 800 MG tablet Take 1 tablet (800 mg total) by mouth 3 (three) times daily with meals. Patient not taking: Reported on 11/30/2018 07/30/18   Mardella Layman, MD  lidocaine (LIDODERM) 5 % Place 1 patch onto the skin daily. Remove & Discard patch within 12 hours or as directed by MD 12/03/18   Nicanor Alcon, April, MD  naproxen (NAPROSYN) 375 MG tablet Take 1 tablet (375 mg total) by mouth 2 (two) times daily with a meal. 12/03/18   Palumbo, April, MD  nitrofurantoin, macrocrystal-monohydrate, (MACROBID) 100 MG capsule Take 1 capsule (100 mg total) by mouth 2 (two) times daily. 02/24/22   Merrilee Jansky, MD  predniSONE (DELTASONE) 20 MG tablet Take 2 tablets daily with breakfast. 02/22/22   Wallis Bamberg, PA-C  promethazine-dextromethorphan (PROMETHAZINE-DM) 6.25-15 MG/5ML syrup Take 5 mLs by mouth 3 (three) times daily as needed for cough. 02/22/22   Wallis Bamberg, PA-C      Allergies    Patient has no known allergies.    Review of Systems   Review of Systems  Gastrointestinal:  Positive for abdominal pain.  All other systems reviewed and  are negative.   Physical Exam Updated Vital Signs BP 122/82 (BP Location: Right Arm)   Pulse 73   Temp 98.3 F (36.8 C) (Oral)   Resp 13   Ht 5\' 1"  (1.549 m)   Wt 61.7 kg   SpO2 100%   BMI 25.70 kg/m  Physical Exam Vitals and nursing note reviewed.  Constitutional:      Appearance: She is well-developed.     Comments: Appears uncomfortable  HENT:     Head: Normocephalic and atraumatic.  Cardiovascular:     Rate and Rhythm: Normal rate and regular rhythm.  Pulmonary:     Effort: Pulmonary effort is normal. No respiratory distress.  Abdominal:      Palpations: Abdomen is soft.     Tenderness: There is no guarding or rebound.     Comments: Moderate generalized abdominal tenderness  Genitourinary:    Comments: Scant vaginal discharge.  No CMT. Musculoskeletal:        General: No tenderness.  Skin:    General: Skin is warm and dry.  Neurological:     Mental Status: She is alert and oriented to person, place, and time.  Psychiatric:        Behavior: Behavior normal.     ED Results / Procedures / Treatments   Labs (all labs ordered are listed, but only abnormal results are displayed) Labs Reviewed  WET PREP, GENITAL - Abnormal; Notable for the following components:      Result Value   Trich, Wet Prep PRESENT (*)    Clue Cells Wet Prep HPF POC PRESENT (*)    WBC, Wet Prep HPF POC >=10 (*)    All other components within normal limits  COMPREHENSIVE METABOLIC PANEL - Abnormal; Notable for the following components:   Sodium 133 (*)    Potassium 3.4 (*)    Glucose, Bld 119 (*)    Total Bilirubin 1.5 (*)    All other components within normal limits  CBC - Abnormal; Notable for the following components:   WBC 17.0 (*)    Hemoglobin 15.1 (*)    All other components within normal limits  URINALYSIS, ROUTINE W REFLEX MICROSCOPIC - Abnormal; Notable for the following components:   APPearance HAZY (*)    Hgb urine dipstick SMALL (*)    Ketones, ur 80 (*)    Nitrite POSITIVE (*)    Leukocytes,Ua SMALL (*)    Bacteria, UA MANY (*)    All other components within normal limits  URINE CULTURE  LIPASE, BLOOD  RPR  HIV ANTIBODY (ROUTINE TESTING W REFLEX)  I-STAT BETA HCG BLOOD, ED ([facility], [facility], [facility] ONLY)  GC/CHLAMYDIA PROBE AMP ([place]) NOT AT [facility]    EKG None  Radiology US Pelvis Complete  Result Date: 06/04/2022 CLINICAL DATA:  Left lower quadrant pain. EXAM: TRANSABDOMINAL AND TRANSVAGINAL ULTRASOUND OF PELVIS DOPPLER ULTRASOUND OF OVARIES TECHNIQUE: Both transabdominal and transvaginal ultrasound examinations of the pelvis  were performed. Transabdominal technique was performed for global imaging of the pelvis including uterus, ovaries, adnexal regions, and pelvic cul-de-sac. It was necessary to proceed with endovaginal exam following the transabdominal exam to visualize the right ovary. Color and duplex Doppler ultrasound was utilized to evaluate blood flow to the ovaries. COMPARISON:  CT with IV contrast earlier today. FINDINGS: Uterus Measurements: Anteverted, prominent measuring 10.1 x 6.5 x 7.5 cm = volume: 256.2 mL. There is a 1.8 cm heterogeneous subserosal fibroid of the anterior fundal uterus. The cervix is closed and is unremarkable. Endometrium  Thickness: 7 mm.  No focal abnormality visualized. Right ovary Measurements: 2.9 x 1.8 x 3.0 cm = volume: 8.6 mL. Normal appearance/no adnexal mass. Left ovary Measurements: 3.9 x 2.3 x 2.9 cm = volume: 12.9 mL. There is an uncomplicated dominant 1.9 cm follicle posteriorly with no other significant findings. Pulsed Doppler evaluation of both ovaries demonstrates normal low-resistance arterial and venous waveforms. Other findings No abnormal free fluid. IMPRESSION: 1. Prominent uterus with 1.8 cm subserosal fibroid of the anterior fundal uterus. 2. Normal appearance of the endometrium. 3. No evidence of ovarian torsion or mass, free fluid or paraovarian mass. Electronically Signed   By: Almira Bar M.D.   On: 06/04/2022 05:22   US Transvaginal Non-OB  Result Date: 06/04/2022 CLINICAL DATA:  Left lower quadrant pain. EXAM: TRANSABDOMINAL AND TRANSVAGINAL ULTRASOUND OF PELVIS DOPPLER ULTRASOUND OF OVARIES TECHNIQUE: Both transabdominal and transvaginal ultrasound examinations of the pelvis were performed. Transabdominal technique was performed for global imaging of the pelvis including uterus, ovaries, adnexal regions, and pelvic cul-de-sac. It was necessary to proceed with endovaginal exam following the transabdominal exam to visualize the right ovary. Color and duplex Doppler  ultrasound was utilized to evaluate blood flow to the ovaries. COMPARISON:  CT with IV contrast earlier today. FINDINGS: Uterus Measurements: Anteverted, prominent measuring 10.1 x 6.5 x 7.5 cm = volume: 256.2 mL. There is a 1.8 cm heterogeneous subserosal fibroid of the anterior fundal uterus. The cervix is closed and is unremarkable. Endometrium Thickness: 7 mm.  No focal abnormality visualized. Right ovary Measurements: 2.9 x 1.8 x 3.0 cm = volume: 8.6 mL. Normal appearance/no adnexal mass. Left ovary Measurements: 3.9 x 2.3 x 2.9 cm = volume: 12.9 mL. There is an uncomplicated dominant 1.9 cm follicle posteriorly with no other significant findings. Pulsed Doppler evaluation of both ovaries demonstrates normal low-resistance arterial and venous waveforms. Other findings No abnormal free fluid. IMPRESSION: 1. Prominent uterus with 1.8 cm subserosal fibroid of the anterior fundal uterus. 2. Normal appearance of the endometrium. 3. No evidence of ovarian torsion or mass, free fluid or paraovarian mass. Electronically Signed   By: Almira Bar M.D.   On: 06/04/2022 05:22   [place] Art/Ven Flow Abd Pelv Doppler  Result Date: 06/04/2022 CLINICAL DATA:  Left lower quadrant pain. EXAM: TRANSABDOMINAL AND TRANSVAGINAL ULTRASOUND OF PELVIS DOPPLER ULTRASOUND OF OVARIES TECHNIQUE: Both transabdominal and transvaginal ultrasound examinations of the pelvis were performed. Transabdominal technique was performed for global imaging of the pelvis including uterus, ovaries, adnexal regions, and pelvic cul-de-sac. It was necessary to proceed with endovaginal exam following the transabdominal exam to visualize the right ovary. Color and duplex Doppler ultrasound was utilized to evaluate blood flow to the ovaries. COMPARISON:  CT with IV contrast earlier today. FINDINGS: Uterus Measurements: Anteverted, prominent measuring 10.1 x 6.5 x 7.5 cm = volume: 256.2 mL. There is a 1.8 cm heterogeneous subserosal fibroid of the anterior  fundal uterus. The cervix is closed and is unremarkable. Endometrium Thickness: 7 mm.  No focal abnormality visualized. Right ovary Measurements: 2.9 x 1.8 x 3.0 cm = volume: 8.6 mL. Normal appearance/no adnexal mass. Left ovary Measurements: 3.9 x 2.3 x 2.9 cm = volume: 12.9 mL. There is an uncomplicated dominant 1.9 cm follicle posteriorly with no other significant findings. Pulsed Doppler evaluation of both ovaries demonstrates normal low-resistance arterial and venous waveforms. Other findings No abnormal free fluid. IMPRESSION: 1. Prominent uterus with 1.8 cm subserosal fibroid of the anterior fundal uterus. 2. Normal appearance of the endometrium. 3. No  evidence of ovarian torsion or mass, free fluid or paraovarian mass. Electronically Signed   By: Almira Bar M.D.   On: 06/04/2022 05:22   CT Abdomen Pelvis W Contrast  Result Date: 06/04/2022 CLINICAL DATA:  Left lower quadrant pain EXAM: CT ABDOMEN AND PELVIS WITH CONTRAST TECHNIQUE: Multidetector CT imaging of the abdomen and pelvis was performed using the standard protocol following bolus administration of intravenous contrast. RADIATION DOSE REDUCTION: This exam was performed according to the departmental dose-optimization program which includes automated exposure control, adjustment of the mA and/or kV according to patient size and/or use of iterative reconstruction technique. CONTRAST:  OMNIPAQUE IOHEXOL 300 MG/ML  SOLN COMPARISON:  11/30/2018 FINDINGS: Lower chest: No acute abnormality. Hepatobiliary: Fatty infiltration of the liver is noted. The gallbladder is within normal limits. Pancreas: Unremarkable. No pancreatic ductal dilatation or surrounding inflammatory changes. Spleen: Normal in size without focal abnormality. Adrenals/Urinary Tract: Adrenal glands are within normal limits. Kidneys demonstrate a normal enhancement pattern bilaterally. No obstructive changes are seen. The bladder is decompressed. Stomach/Bowel: No obstructive or  inflammatory changes of the colon are seen. The appendix is within normal limits. Small bowel and stomach are unremarkable. Vascular/Lymphatic: No significant vascular findings are present. No enlarged abdominal or pelvic lymph nodes. Reproductive: Uterus is well visualized. Enhancing nodule is noted near the fundus measuring 2.3 cm consistent with uterine fibroid change. An involuting cyst is noted in the left ovary likely contributing to the patient's symptomatology. No free fluid is noted. Other: No abdominal wall hernia or abnormality. No abdominopelvic ascites. Musculoskeletal: No acute or significant osseous findings. IMPRESSION: Involuting cyst in the left ovary likely contribute to the patient's symptomatology. Uterine fibroid change. Fatty liver. Electronically Signed   By: Alcide Clever M.D.   On: 06/04/2022 02:38    Procedures Procedures    Medications Ordered in ED Medications  lactated ringers bolus 1,000 mL (0 mLs Intravenous Stopped 06/04/22 0245)  morphine (PF) 4 MG/ML injection 4 mg (4 mg Intravenous Given 06/04/22 0205)  ondansetron (ZOFRAN) injection 4 mg (4 mg Intravenous Given 06/04/22 0205)  cefTRIAXone (ROCEPHIN) 1 g in sodium chloride 0.9 % 100 mL IVPB (0 g Intravenous Stopped 06/04/22 0245)  iohexol (OMNIPAQUE) 300 MG/ML solution 100 mL (100 mLs Intravenous Contrast Given 06/04/22 0213)  ketorolac (TORADOL) 15 MG/ML injection 15 mg (15 mg Intravenous Given 06/04/22 0345)  morphine (PF) 4 MG/ML injection 4 mg (4 mg Intravenous Given 06/04/22 0419)  doxycycline (VIBRA-TABS) tablet 100 mg (100 mg Oral Given 06/04/22 0604)  metroNIDAZOLE (FLAGYL) tablet 500 mg (500 mg Oral Given 06/04/22 9244)    ED Course/ Medical Decision Making/ A&P                             Medical Decision Making Amount and/or Complexity of Data Reviewed Labs: ordered. Radiology: ordered.  Risk Prescription drug management.   Patient here for evaluation of lower abdominal pain, vomiting.  UA is concerning for  UTI.  CBC with leukocytosis.  CMP with mild hyponatremia, otherwise no additional acute abnormality.  CT abdomen pelvis was obtained, which is concerning for possible left ovarian cyst.  Given her symptoms a pelvic ultrasound was obtained, which is benign.  Pelvic examination has small amount of discharge but is otherwise benign.  No evidence of obstructing stone.  Patient is positive for trichomonas.  Given her abdominal pain will treat for PID although pelvic examination is not fully consistent with PID.  Question if  her pain is more related to the cyst.  She was treated with Rocephin.  Will send a urine culture and continue treatment at home with doxycycline and Flagyl.  If her culture grows out an organism that is not sensitive to this medication she will require an additional antibiotic.  Discussed close outpatient follow-up as well as return precautions if she has progressive or concerning symptoms.        Final Clinical Impression(s) / ED Diagnoses Final diagnoses:  Pelvic pain  Trichomonal cervicitis  Cyst of left ovary    Rx / DC Orders ED Discharge Orders          Ordered    doxycycline (VIBRAMYCIN) 100 MG capsule  2 times daily        06/04/22 0611    metroNIDAZOLE (FLAGYL) 500 MG tablet  2 times daily        06/04/22 0611    ondansetron (ZOFRAN) 4 MG tablet  Every 8 hours PRN        06/04/22 0611    HYDROcodone-acetaminophen (NORCO/VICODIN) 5-325 MG tablet  Every 6 hours PRN        06/04/22 1610              Tilden Fossa, MD 06/04/22 [phone]

## 2022-06-04 NOTE — ED Triage Notes (Addendum)
Patient brought in by [facility], reports intermittent abd pain since yesterday. States pain started yesterday morning then stopped then restarted tonight. Endorses n/v. Last BM was 3 hours ago. Denies any issue with urination. Took tylenol at 5pm, also endorses chills/sweats at home. Also states last period was in January, has hx of irregular menstrual cycle but also chance of pregnancy per pt.

## 2022-06-05 LAB — GC/CHLAMYDIA PROBE AMP (~~LOC~~) NOT AT ARMC
Chlamydia: NEGATIVE
Comment: NEGATIVE
Comment: NORMAL
Neisseria Gonorrhea: NEGATIVE

## 2022-06-05 LAB — URINE CULTURE: Culture: >=100000 — AB

## 2022-06-06 LAB — URINE CULTURE

## 2022-06-07 ENCOUNTER — Telehealth (HOSPITAL_BASED_OUTPATIENT_CLINIC_OR_DEPARTMENT_OTHER): Payer: Self-pay | Admitting: Emergency Medicine

## 2022-06-07 NOTE — Telephone Encounter (Signed)
Post ED Visit - Positive Culture Follow-up: Successful Patient Follow-Up  Culture assessed and recommendations reviewed by:  []  Enzo Bi, Pharm.D. []  Celedonio Miyamoto, Pharm.D., BCPS AQ-ID []  Garvin Fila, Pharm.D., BCPS []  Georgina Pillion, Pharm.D., BCPS []  [place], [place].D., BCPS, AAHIVP []  Estella Husk, Pharm.D., BCPS, AAHIVP []  Lysle Pearl, PharmD, BCPS []  Phillips Climes, PharmD, BCPS []  Agapito Games, PharmD, BCPS []  Verlan Friends, PharmD  Positive urine culture  []  Patient discharged without antimicrobial prescription and treatment is now indicated []  Organism is resistant to prescribed ED discharge antimicrobial []  Patient with positive blood cultures Symptom check, if urinary symptoms, start cephalexin 500mg  po bid x 5 days  Changes discussed with ED provider: Dr Tanda Rockers New antibiotic prescription if needed Cephalexin 500mg  po bid x 5 days  Attempting to contact patient   Berle Mull 06/07/2022, 9:35 AM

## 2022-06-07 NOTE — Progress Notes (Signed)
ED Antimicrobial Stewardship Positive Culture Follow Up   Valerie Reyes is an 37 y.o. female who presented to [facility] on 06/04/2022 with a chief complaint of  Chief Complaint  Patient presents with   Abdominal Pain    Recent Results (from the past 720 hour(s))  Wet prep, genital     Status: Abnormal   Collection Time: 06/04/22  1:56 AM  Result Value Ref Range Status   Yeast Wet Prep HPF POC NONE SEEN NONE SEEN Final   Trich, Wet Prep PRESENT (A) NONE SEEN Final   Clue Cells Wet Prep HPF POC PRESENT (A) NONE SEEN Final   WBC, Wet Prep HPF POC >=10 (A) <10 Final   Sperm NONE SEEN  Final    Comment: Performed at [facility], [address], [place], [place] [address]  Urine Culture     Status: Abnormal   Collection Time: 06/04/22  2:05 AM   Specimen: Urine, Clean Catch  Result Value Ref Range Status   Specimen Description   Final    URINE, CLEAN CATCH Performed at [facility], [address] [place], [place] [address]    Special Requests   Final    NONE Performed at [facility], [address] [place], [place] [address]    Culture >=100,000 COLONIES/mL ESCHERICHIA COLI (A)  Final   Report Status 06/06/2022 FINAL  Final   Organism ID, Bacteria ESCHERICHIA COLI (A)  Final      Susceptibility   Escherichia coli - MIC*    AMPICILLIN >=32 RESISTANT Resistant     CEFAZOLIN <=4 SENSITIVE Sensitive     CEFEPIME <=0.12 SENSITIVE Sensitive     CEFTRIAXONE <=0.25 SENSITIVE Sensitive     CIPROFLOXACIN >=4 RESISTANT Resistant     GENTAMICIN <=1 SENSITIVE Sensitive     IMIPENEM <=0.25 SENSITIVE Sensitive     NITROFURANTOIN <=16 SENSITIVE Sensitive     TRIMETH/SULFA <=20 SENSITIVE Sensitive     AMPICILLIN/SULBACTAM 8 SENSITIVE Sensitive     PIP/TAZO <=4 SENSITIVE Sensitive     * >=100,000 COLONIES/mL ESCHERICHIA COLI   [name] with left lower quadrant abdominal pain. No dysuria reported but patient did report pressure  with urination. Pelvic exam revealed a small amount of vaginal discharge. Wet prep positive for trichomonas. Empirically was prescribed doxycycline and metronidazole to treat PID. Pregnancy test negative. UA with 11-20 WBC, 6-10 squamous cells, many bacteria.   Plan: If having urinary symptoms, start cephalexin 500 mg PO BID x 5 days. Continue doxycycline and metronidazole.   ED Provider: Tanda Rockers, DO   Larena Sox, PharmD PGY1 Pharmacy Resident   06/07/2022  8:27 AM  Clinical Pharmacist Monday - Friday phone -  [phone] Saturday - Sunday phone - [phone]

## 2022-06-13 ENCOUNTER — Telehealth (HOSPITAL_BASED_OUTPATIENT_CLINIC_OR_DEPARTMENT_OTHER): Payer: Self-pay | Admitting: Emergency Medicine

## 2023-02-28 NOTE — L&D Delivery Note (Addendum)
 LABOR COURSE Valerie Reyes is a 38 y.o. female G2P1001 with IUP at [redacted]w[redacted]d by LMP c/w 13 wk US  presenting for SROM. She was started on pitocin  and progressed to complete.   Delivery Note Called to room and patient was complete and pushing. Head delivered 1543. No nuchal cord present. Shoulder and body delivered in usual fashion. At 1544 a viable female was delivered via Vaginal, Spontaneous delivery.  Infant with spontaneous cry, placed on mother's abdomen, dried and stimulated. Cord clamped x 2 after 1-minute delay, and cut by mother. Cord blood drawn. Placenta delivered spontaneously with gentle cord traction. Appears intact. Fundus firm and good hemostasis was achieved with massage and Pitocin . Labia, perineum, vagina, and cervix inspected with a periurethral laceration at 11 o'clock and a left labial laceration at 3 o'clock.    APGAR: 6, 6; weight pend .   Cord: 3VC with the following complications:none.     Anesthesia:   Episiotomy: None Lacerations: midline periurethral, left labial Suture Repair: 3.0 monocryl x1 L labial Est. Blood Loss (mL): 100  Mom to postpartum.  Baby to Couplet care / Skin to Skin.  Shana Squires, MD @TODAY @ 2:52 PM

## 2023-04-05 ENCOUNTER — Ambulatory Visit: Payer: Self-pay

## 2023-04-05 ENCOUNTER — Ambulatory Visit
Admission: RE | Admit: 2023-04-05 | Discharge: 2023-04-05 | Disposition: A | Discharge status: Home / Self Care | Payer: MEDICAID | Source: Ambulatory Visit | Attending: Internal Medicine | Admitting: Internal Medicine

## 2023-04-05 VITALS — BP 112/83 | HR 77 | Temp 98.2°F | Resp 16 | Ht 61.0 in | Wt 140.0 lb

## 2023-04-05 DIAGNOSIS — Z9189 Other specified personal risk factors, not elsewhere classified: Secondary | ICD-10-CM | POA: Insufficient documentation

## 2023-04-05 DIAGNOSIS — Z3201 Encounter for pregnancy test, result positive: Secondary | ICD-10-CM | POA: Insufficient documentation

## 2023-04-05 DIAGNOSIS — N3001 Acute cystitis with hematuria: Secondary | ICD-10-CM | POA: Insufficient documentation

## 2023-04-05 LAB — POCT URINALYSIS DIP (MANUAL ENTRY)
Bilirubin, UA: NEGATIVE
Blood, UA: NEGATIVE
Glucose, UA: NEGATIVE mg/dL
Ketones, POC UA: NEGATIVE mg/dL
Leukocytes, UA: NEGATIVE
Nitrite, UA: POSITIVE — AB
Protein Ur, POC: NEGATIVE mg/dL
Spec Grav, UA: >=1.03 — AB
Urobilinogen, UA: 0.2 U/dL
pH, UA: 6

## 2023-04-05 LAB — POCT URINE PREGNANCY: Preg Test, Ur: POSITIVE — AB

## 2023-04-05 MED ORDER — CEPHALEXIN 500 MG PO CAPS: 500.0000 mg | ORAL_CAPSULE | Freq: Two times a day (BID) | ORAL | 0 refills | Status: AC

## 2023-04-05 NOTE — ED Provider Notes (Signed)
 [facility]    CSN: [id number] Arrival date & time: 04/05/23  1633      History   Chief Complaint Chief Complaint  Patient presents with   Exposure to STD   Appointment    HPI An Valerie Reyes is a 38 y.o. female.   Valerie Reyes is a 38 y.o. female presenting for chief complaint of white vaginal discharge and vaginal odor that started approximately 1 week ago.  She additionally reports dysuria starting 1 week ago as well.  Denies urinary frequency, urinary urgency, flank pain, gross hematuria, dizziness, headache, and fever/chills.  Recent antibiotic or steroid use reported.  She would like STD testing today as her fianc was recently unfaithful and had unprotected intercourse with someone else.  She does not know of any known exposures to STD and would like to be tested for all STDs including HIV and syphilis.  She would like a pregnancy test today as well.  History of PCOS, she has irregular menstrual cycles.  Most recent menstrual cycle was February 03, 2023.  She does not take any form of contraception and is sexually active unprotected with female partner.   Exposure to STD    Past Medical History:  Diagnosis Date   Bronchitis    History of kidney infection    Medical history non-contributory    Pregnancy    Trichomonosis     Patient Active Problem List   Diagnosis Date Noted   UTI (urinary tract infection) 04/28/2016   Late prenatal care 04/23/2012    Past Surgical History:  Procedure Laterality Date   CESAREAN SECTION N/A 09/01/2012   Procedure: CESAREAN SECTION;  Surgeon: Burnard VEAR Pate, MD;  Location: [facility];  Service: Obstetrics;  Laterality: N/A;   NO PAST SURGERIES      OB History     Gravida  2   Para  1   Term  1   Preterm  0   AB  0   Living  1      SAB  0   IAB  0   Ectopic  0   Multiple  0   Live Births  1            Home Medications    Prior to Admission medications   Medication Sig Start Date End Date  Taking? Authorizing Provider  cephALEXin  (KEFLEX ) 500 MG capsule Take 1 capsule (500 mg total) by mouth 2 (two) times daily for 7 days. 04/05/23 04/12/23 Yes Glenroy Crossen, Dorna HERO, FNP    Family History Family History  Problem Relation Age of Onset   Cancer Mother    Diabetes Mother    Hypertension Mother    Cancer Maternal Aunt        lung   Diabetes Maternal Grandmother    Stroke Maternal Grandmother    Hypertension Maternal Grandmother    Hypertension Paternal Grandmother    Diabetes Paternal Grandmother     Social History Social History   Tobacco Use   Smoking status: Every Day    Current packs/day: 1.00    Types: Cigarettes   Smokeless tobacco: Never  Vaping Use   Vaping status: Never Used  Substance Use Topics   Alcohol use: No   Drug use: Not Currently     Allergies   Patient has no known allergies.   Review of Systems Review of Systems Per HPI  Physical Exam Triage Vital Signs ED Triage Vitals  Encounter Vitals Group     BP 04/05/23  1717 112/83     Systolic BP Percentile --      Diastolic BP Percentile --      Pulse Rate 04/05/23 1717 77     Resp 04/05/23 1717 16     Temp 04/05/23 1717 98.2 F (36.8 C)     Temp Source 04/05/23 1717 Oral     SpO2 04/05/23 1717 94 %     Weight 04/05/23 1717 140 lb (63.5 kg)     Height 04/05/23 1717 5' 1 (1.549 m)     Head Circumference --      Peak Flow --      Pain Score 04/05/23 1715 7     Pain Loc --      Pain Education --      Exclude from Growth Chart --    No data found.  Updated Vital Signs BP 112/83 (BP Location: Right Arm)   Pulse 77   Temp 98.2 F (36.8 C) (Oral)   Resp 16   Ht 5' 1 (1.549 m)   Wt 140 lb (63.5 kg)   LMP 02/03/2023 (Approximate)   SpO2 94%   BMI 26.45 kg/m   Visual Acuity Right Eye Distance:   Left Eye Distance:   Bilateral Distance:    Right Eye Near:   Left Eye Near:    Bilateral Near:     Physical Exam Vitals and nursing note reviewed.  Constitutional:       Appearance: She is not ill-appearing or toxic-appearing.  HENT:     Head: Normocephalic and atraumatic.     Right Ear: Hearing, tympanic membrane, ear canal and external ear normal.     Left Ear: Hearing, tympanic membrane, ear canal and external ear normal.     Nose: Nose normal.     Mouth/Throat:     Lips: Pink.     Mouth: Mucous membranes are moist. No injury or oral lesions.     Dentition: Normal dentition.     Tongue: No lesions.     Pharynx: Oropharynx is clear. Uvula midline. No pharyngeal swelling, oropharyngeal exudate, posterior oropharyngeal erythema, uvula swelling or postnasal drip.     Tonsils: No tonsillar exudate.  Eyes:     General: Lids are normal. Vision grossly intact. Gaze aligned appropriately.     Extraocular Movements: Extraocular movements intact.     Conjunctiva/sclera: Conjunctivae normal.  Neck:     Trachea: Trachea and phonation normal.  Cardiovascular:     Rate and Rhythm: Normal rate and regular rhythm.     Heart sounds: Normal heart sounds, S1 normal and S2 normal.  Pulmonary:     Effort: Pulmonary effort is normal. No respiratory distress.     Breath sounds: Normal breath sounds and air entry. No wheezing, rhonchi or rales.  Chest:     Chest wall: No tenderness.  Musculoskeletal:     Cervical back: Neck supple.  Lymphadenopathy:     Cervical: No cervical adenopathy.  Skin:    General: Skin is warm and dry.     Capillary Refill: Capillary refill takes less than 2 seconds.     Findings: No rash.  Neurological:     General: No focal deficit present.     Mental Status: She is alert and oriented to person, place, and time. Mental status is at baseline.     Cranial Nerves: No dysarthria or facial asymmetry.  Psychiatric:        Mood and Affect: Mood normal.  Speech: Speech normal.        Behavior: Behavior normal.        Thought Content: Thought content normal.        Judgment: Judgment normal.      UC Treatments / Results  Labs (all  labs ordered are listed, but only abnormal results are displayed) Labs Reviewed  POCT URINE PREGNANCY - Abnormal; Notable for the following components:      Result Value   Preg Test, Ur Positive (*)    All other components within normal limits  POCT URINALYSIS DIP (MANUAL ENTRY) - Abnormal; Notable for the following components:   Color, UA straw (*)    Clarity, UA hazy (*)    Spec Grav, UA >=1.030 (*)    Nitrite, UA Positive (*)    All other components within normal limits  URINE CULTURE  HIV ANTIBODY (ROUTINE TESTING W REFLEX)  RPR  CERVICOVAGINAL ANCILLARY ONLY    EKG   Radiology No results found.  Procedures Procedures (including critical care time)  Medications Ordered in UC Medications - No data to display  Initial Impression / Assessment and Plan / UC Course  I have reviewed the triage vital signs and the nursing notes.  Pertinent labs & imaging results that were available during my care of the patient were reviewed by me and considered in my medical decision making (see chart for details).   1.  At risk for STD due to unprotected sex STI labs pending, will notify patient of positive results and treat accordingly per protocol when labs result.  Patient would like HIV and syphilis testing today.   Patient to avoid sexual intercourse until screening testing comes back.   Education provided regarding safe sexual practices and patient encouraged to use protection to prevent spread of STIs.    2.  Positive urine pregnancy test Urine pregnancy test is positive in the clinic.   Prenatal vitamin daily, stop ibuprofen , may take tylenol  for pain instead.  Given list of safe OTC medications in pregnancy.  OB/GYN provider list given, advised to schedule appointment for prenatal care MAU precautions given, no current red flag signs of pregnancy related emergency.   3.  Acute cystitis with hematuria Urinalysis shows likely urinary tract infection.  Given pregnancy status, I  would like to go ahead and treat empirically with Keflex  twice daily for 7 days.  She is not allergic to any medications.  Urine culture is pending.  Encouraged to drink plenty of water and avoid use of urinary irritants.  Counseled patient on potential for adverse effects with medications prescribed/recommended today, strict ER and return-to-clinic precautions discussed, patient verbalized understanding.    Final Clinical Impressions(s) / UC Diagnoses   Final diagnoses:  At risk for sexually transmitted disease due to unprotected sex  Positive urine pregnancy test  Acute cystitis with hematuria     Discharge Instructions      Your pregnancy test is positive.  You also have a UTI.  Take keflex  twice a day for 7 days. Take antibiotic with food to avoid stomach upset.   - Refer to the list of safe medications in pregnancy you were given at urgent care today and do not take any other medications over-the-counter other than what is on this list.  You may only take Tylenol  for pain and discomfort/fever.  Do not take ibuprofen  in pregnancy.  - Drink plenty of water (at least 8 cups/day) to stay well-hydrated.  - Purchase a prenatal vitamin over the counter  and take this once daily.  - Avoid smoking cigarettes, marijuana, and other drug use. Do not drink alcohol while pregnant.  If you develop any severe abdominal pain, severe low back pain, vaginal bleeding, or any other pregnancy related emergency, please go to the maternity assessment unit located at entrance C of [facility]  Call the OB/GYN listed below to schedule an appointment to establish care so that you can be monitored throughout your pregnancy.   [facility]            [address] [place], [place] [address] [phone]   Center for [facility]                                                            [address], [place], [place],  [address] [phone]    ED Prescriptions     Medication Sig Dispense Auth. Provider   cephALEXin  (KEFLEX ) 500 MG capsule Take 1 capsule (500 mg total) by mouth 2 (two) times daily for 7 days. 14 capsule Enedelia Dorna HERO, FNP      PDMP not reviewed this encounter.   Enedelia Dorna HERO, [place] 04/05/23 TRENNA

## 2023-04-05 NOTE — Discharge Instructions (Signed)
 Your pregnancy test is positive.  You also have a UTI.  Take keflex  twice a day for 7 days. Take antibiotic with food to avoid stomach upset.   - Refer to the list of safe medications in pregnancy you were given at urgent care today and do not take any other medications over-the-counter other than what is on this list.  You may only take Tylenol  for pain and discomfort/fever.  Do not take ibuprofen  in pregnancy.  - Drink plenty of water (at least 8 cups/day) to stay well-hydrated.  - Purchase a prenatal vitamin over the counter and take this once daily.  - Avoid smoking cigarettes, marijuana, and other drug use. Do not drink alcohol while pregnant.  If you develop any severe abdominal pain, severe low back pain, vaginal bleeding, or any other pregnancy related emergency, please go to the maternity assessment unit located at entrance C of [facility].  Call the OB/GYN listed below to schedule an appointment to establish care so that you can be monitored throughout your pregnancy.   [facility]            [address], [place], [place] [address] [phone]   Center for [employer] at [facility]                                                            [address], [address], [place], [place], [address] [phone]

## 2023-04-05 NOTE — ED Triage Notes (Signed)
 I think I have been exposed to an STD or pregnant at the same time plus I need checking of yeast infection or urinary tract - Entered by patient.  Patient having foul vaginal odor, discharge, and burning onset 1 week ago. Patient also having urinary frequency and abdominal pain with breast soreness. Patient is also late for her period.

## 2023-04-06 ENCOUNTER — Telehealth (HOSPITAL_BASED_OUTPATIENT_CLINIC_OR_DEPARTMENT_OTHER): Payer: Self-pay

## 2023-04-06 LAB — CERVICOVAGINAL ANCILLARY ONLY
Bacterial Vaginitis (gardnerella): POSITIVE — AB
Candida Glabrata: NEGATIVE
Candida Vaginitis: NEGATIVE
Chlamydia: NEGATIVE
Comment: NEGATIVE
Comment: NEGATIVE
Comment: NEGATIVE
Comment: NEGATIVE
Comment: NEGATIVE
Comment: NORMAL
Neisseria Gonorrhea: NEGATIVE
Trichomonas: NEGATIVE

## 2023-04-06 LAB — URINE CULTURE: Culture: NO GROWTH

## 2023-04-06 LAB — HIV ANTIBODY (ROUTINE TESTING W REFLEX): HIV Screen 4th Generation wRfx: NONREACTIVE

## 2023-04-06 LAB — RPR: RPR Ser Ql: NONREACTIVE

## 2023-04-06 MED ORDER — METRONIDAZOLE 0.75 % VA GEL: 1.0000 | Freq: Every day | VAGINAL | 0 refills | Status: AC

## 2023-04-06 MED ORDER — METRONIDAZOLE 500 MG PO TABS: 500.0000 mg | ORAL_TABLET | Freq: Two times a day (BID) | ORAL | 0 refills | Status: DC

## 2023-04-06 NOTE — Telephone Encounter (Signed)
 Per protocol, pt requires tx with metronidazole. Rx sent to pharmacy on file.

## 2023-07-20 LAB — OB RESULTS CONSOLE RPR: RPR: NONREACTIVE

## 2023-07-20 LAB — OB RESULTS CONSOLE ABO/RH: RH Type: POSITIVE

## 2023-07-20 LAB — OB RESULTS CONSOLE HGB/HCT, BLOOD
HCT: 39 (ref 29–41)
Hemoglobin: 13.4

## 2023-07-20 LAB — OB RESULTS CONSOLE PLATELET COUNT: Platelets: 248

## 2023-07-20 LAB — OB RESULTS CONSOLE GC/CHLAMYDIA
Chlamydia: NEGATIVE
Neisseria Gonorrhea: NEGATIVE
Neisseria Gonorrhea: NEGATIVE

## 2023-07-20 LAB — OB RESULTS CONSOLE HEPATITIS B SURFACE ANTIGEN: Hepatitis B Surface Ag: NEGATIVE

## 2023-07-20 LAB — OB RESULTS CONSOLE ANTIBODY SCREEN: Antibody Screen: NEGATIVE

## 2023-07-20 LAB — HEPATITIS C ANTIBODY: HCV Ab: NEGATIVE

## 2023-07-20 LAB — OB RESULTS CONSOLE TSH: TSH: 1.206

## 2023-07-20 LAB — OB RESULTS CONSOLE VARICELLA ZOSTER ANTIBODY, IGG: Varicella: IMMUNE

## 2023-07-20 LAB — OB RESULTS CONSOLE RUBELLA ANTIBODY, IGM: Rubella: IMMUNE

## 2023-07-20 LAB — OB RESULTS CONSOLE HIV ANTIBODY (ROUTINE TESTING): HIV: NONREACTIVE

## 2023-07-21 DIAGNOSIS — F32A Depression, unspecified: Secondary | ICD-10-CM | POA: Insufficient documentation

## 2023-07-21 DIAGNOSIS — F39 Unspecified mood [affective] disorder: Secondary | ICD-10-CM | POA: Insufficient documentation

## 2023-07-21 DIAGNOSIS — Z98891 History of uterine scar from previous surgery: Secondary | ICD-10-CM | POA: Insufficient documentation

## 2023-07-21 DIAGNOSIS — F4310 Post-traumatic stress disorder, unspecified: Secondary | ICD-10-CM | POA: Insufficient documentation

## 2023-07-21 DIAGNOSIS — A599 Trichomoniasis, unspecified: Secondary | ICD-10-CM | POA: Insufficient documentation

## 2023-07-21 DIAGNOSIS — O09899 Supervision of other high risk pregnancies, unspecified trimester: Secondary | ICD-10-CM | POA: Insufficient documentation

## 2023-07-26 LAB — CYTOLOGY - PAP: Pap: NEGATIVE

## 2023-08-07 ENCOUNTER — Telehealth: Payer: MEDICAID

## 2023-08-07 DIAGNOSIS — Z3A25 25 weeks gestation of pregnancy: Secondary | ICD-10-CM | POA: Diagnosis not present

## 2023-08-07 DIAGNOSIS — O0990 Supervision of high risk pregnancy, unspecified, unspecified trimester: Secondary | ICD-10-CM | POA: Insufficient documentation

## 2023-08-07 DIAGNOSIS — O0992 Supervision of high risk pregnancy, unspecified, second trimester: Secondary | ICD-10-CM

## 2023-08-07 MED ORDER — PRENATAL PLUS VITAMIN/MINERAL 27-1 MG PO TABS: 1.0000 | ORAL_TABLET | Freq: Every day | ORAL | 11 refills | Status: AC

## 2023-08-07 NOTE — Progress Notes (Signed)
 New OB Intake  I connected with Valerie Reyes  on 08/08/23 at 10:15 AM EDT by MyChart Video Visit and verified that I am speaking with the correct person using two identifiers. Nurse is located at [facility] and pt is located at home.  I discussed the limitations, risks, security and privacy concerns of performing an evaluation and management service by telephone and the availability of in person appointments. I also discussed with the patient that there may be a patient responsible charge related to this service. The patient expressed understanding and agreed to proceed.  I explained I am completing New OB Intake today. We discussed EDD 11/20/23 based on LMP, confirmed by US  at [redacted]w[redacted]d. Pt is G2P1001. I reviewed her allergies, medications and Medical/Surgical/OB history.    Patient Active Problem List   Diagnosis Date Noted   Supervision of high risk pregnancy, antepartum 08/07/2023   Trichomonas infection 07/21/2023   History of C-section 07/21/2023   PTSD (post-traumatic stress disorder) 07/21/2023   Depression affecting pregnancy in second trimester, antepartum 07/21/2023    Concerns addressed today [facility] records indicate possible need for [facility] cardiology referral. Record states patient reported the following: enlarged left side in the heart and arrythmia. Patient was unable to see cardiology prior to transfer of care.  Very concerned about impact of cocaine use during pregnancy (within last 30 days). Offered appointment with REACH clinic following new OB to speak with REACH provider and neonatal NPs. Unclear if SUD.  Delivery Plans Plans to deliver at [facility]. Discussed the nature of our practice with multiple providers including residents and students. Due to the size of the practice, the delivering provider may not be the same as those providing prenatal care.   Patient is not a water birth candidate.  MyChart/Babyscripts MyChart access verified. I explained pt will have some visits in  office and some virtually. Babyscripts instructions given and order placed.   Blood Pressure Cuff/Weight Scale Not addressed.  Anatomy US  Completed prior to transfer.  Is patient a CenteringPregnancy candidate?  Possible REACH    Is patient a Mom+Baby Combined Care candidate?  Not a candidate    First visit review I reviewed new OB appt with patient. Explained pt will be seen by Tiffany Foerster, MD at first visit. Will need 2 HR GTT at first visit. Instructed to present to visit fasting.  Last Pap NILM, HPV neg 07/20/23  Fonda Hymen, RN 08/08/2023  6:20 PM

## 2023-08-14 ENCOUNTER — Other Ambulatory Visit: Payer: Self-pay

## 2023-08-14 DIAGNOSIS — O0990 Supervision of high risk pregnancy, unspecified, unspecified trimester: Secondary | ICD-10-CM

## 2023-08-15 ENCOUNTER — Encounter: Payer: Self-pay | Admitting: Family Medicine

## 2023-08-15 ENCOUNTER — Other Ambulatory Visit: Payer: Self-pay

## 2023-08-15 ENCOUNTER — Other Ambulatory Visit (HOSPITAL_COMMUNITY)
Admission: RE | Admit: 2023-08-15 | Discharge: 2023-08-15 | Disposition: A | Discharge status: Home / Self Care | Payer: MEDICAID | Source: Ambulatory Visit | Attending: Family Medicine | Admitting: Family Medicine

## 2023-08-15 ENCOUNTER — Ambulatory Visit: Payer: MEDICAID | Admitting: Family Medicine

## 2023-08-15 ENCOUNTER — Other Ambulatory Visit: Payer: MEDICAID

## 2023-08-15 VITALS — BP 110/64 | HR 92 | Wt 158.0 lb

## 2023-08-15 DIAGNOSIS — O09529 Supervision of elderly multigravida, unspecified trimester: Secondary | ICD-10-CM | POA: Insufficient documentation

## 2023-08-15 DIAGNOSIS — O0992 Supervision of high risk pregnancy, unspecified, second trimester: Secondary | ICD-10-CM

## 2023-08-15 DIAGNOSIS — A599 Trichomoniasis, unspecified: Secondary | ICD-10-CM | POA: Diagnosis present

## 2023-08-15 DIAGNOSIS — O09522 Supervision of elderly multigravida, second trimester: Secondary | ICD-10-CM

## 2023-08-15 DIAGNOSIS — Z98891 History of uterine scar from previous surgery: Secondary | ICD-10-CM | POA: Diagnosis not present

## 2023-08-15 DIAGNOSIS — O0990 Supervision of high risk pregnancy, unspecified, unspecified trimester: Secondary | ICD-10-CM

## 2023-08-15 DIAGNOSIS — Z3A26 26 weeks gestation of pregnancy: Secondary | ICD-10-CM | POA: Diagnosis not present

## 2023-08-15 NOTE — Progress Notes (Signed)
   INITIAL PRENATAL VISIT NOTE  Subjective:  Carolynn Bullis is a 38 y.o. G2P1001 at [redacted]w[redacted]d being seen today for Transferring prenatal care from [facility].  She is currently monitored for the following issues for this high-risk pregnancy and has Trichomonas infection; History of C-section; PTSD (post-traumatic stress disorder); Depression affecting pregnancy in second trimester, antepartum; Supervision of high risk pregnancy, antepartum; and AMA (advanced maternal age) multigravida 35+ on their problem list.  Patient reports no complaints.  Contractions: Not present. Vag. Bleeding: None.  Movement: Present. Denies leaking of fluid.   The following portions of the patient's history were reviewed and updated as appropriate: allergies, current medications, past family history, past medical history, past social history, past surgical history and problem list.   Objective:    Vitals:   08/15/23 0923  BP: 110/64  Pulse: 92  Weight: 158 lb (71.7 kg)    Fetal Status:  Fetal Heart Rate (bpm): 154   Movement: Present    General: Alert, oriented and cooperative. Patient is in no acute distress.  Skin: Skin is warm and dry. No rash noted.   Cardiovascular: Normal heart rate noted  Respiratory: Normal respiratory effort, no problems with respiration noted  Abdomen: Soft, gravid, appropriate for gestational age.  Pain/Pressure: Absent     Pelvic: Cervical exam deferred        Extremities: Normal range of motion.  Edema: None  Mental Status: Normal mood and affect. Normal behavior. Normal judgment and thought content.   Assessment and Plan:  Pregnancy: G2P1001 at [redacted]w[redacted]d 1. [redacted] weeks gestation of pregnancy (Primary)   2. Supervision of high risk pregnancy, antepartum Needs f/u growth due to IPV, cocaine use, tobacco use and AMA 28 week labs today PN labs reviewed and anatomy u/s - US  MFM OB DETAIL +14 WK; Future  3. History of C-section Considering, but leaning toward TOLAC  4.  Multigravida of advanced maternal age in second trimester Desires genetics  - PANORAMA PRENATAL TEST  5. Trichomonas infection Would like TOC - Cervicovaginal ancillary only( [place])  Preterm labor symptoms and general obstetric precautions including but not limited to vaginal bleeding, contractions, leaking of fluid and fetal movement were reviewed in detail with the patient. Please refer to After Visit Summary for other counseling recommendations.   Return in 2 weeks (on 08/29/2023) for REACH clinic.  Future Appointments  Date Time Provider Department Center  08/28/2023  7:00 AM [facility] PROVIDER 1 [facility]  08/28/2023  7:30 AM [facility] US6 [facility]  08/28/2023  2:35 PM Felipe Horton, [place] , CNM [facility]    [place] Layer, MD

## 2023-08-16 ENCOUNTER — Ambulatory Visit: Payer: Self-pay | Admitting: Family Medicine

## 2023-08-16 ENCOUNTER — Telehealth: Payer: Self-pay

## 2023-08-16 ENCOUNTER — Other Ambulatory Visit: Payer: Self-pay

## 2023-08-16 DIAGNOSIS — O24419 Gestational diabetes mellitus in pregnancy, unspecified control: Secondary | ICD-10-CM | POA: Insufficient documentation

## 2023-08-16 DIAGNOSIS — O24414 Gestational diabetes mellitus in pregnancy, insulin controlled: Secondary | ICD-10-CM

## 2023-08-16 DIAGNOSIS — O0990 Supervision of high risk pregnancy, unspecified, unspecified trimester: Secondary | ICD-10-CM

## 2023-08-16 DIAGNOSIS — O24410 Gestational diabetes mellitus in pregnancy, diet controlled: Secondary | ICD-10-CM

## 2023-08-16 HISTORY — DX: Gestational diabetes mellitus in pregnancy, insulin controlled: O24.414

## 2023-08-16 HISTORY — DX: Gestational diabetes mellitus in pregnancy, unspecified control: O24.419

## 2023-08-16 LAB — CERVICOVAGINAL ANCILLARY ONLY
Chlamydia: NEGATIVE
Comment: NEGATIVE
Comment: NEGATIVE
Comment: NORMAL
Neisseria Gonorrhea: NEGATIVE
Trichomonas: POSITIVE — AB

## 2023-08-16 LAB — CBC
Hematocrit: 36 % (ref 34.0–46.6)
Hemoglobin: 11.9 g/dL (ref 11.1–15.9)
MCH: 30.8 pg (ref 26.6–33.0)
MCHC: 33.1 g/dL (ref 31.5–35.7)
MCV: 93 fL (ref 79–97)
Platelets: 213 10*3/uL (ref 150–450)
RBC: 3.86 x10E6/uL (ref 3.77–5.28)
RDW: 13.1 % (ref 11.7–15.4)
WBC: 9 10*3/uL (ref 3.4–10.8)

## 2023-08-16 LAB — RPR: RPR Ser Ql: NONREACTIVE

## 2023-08-16 LAB — HIV ANTIBODY (ROUTINE TESTING W REFLEX): HIV Screen 4th Generation wRfx: NONREACTIVE

## 2023-08-16 LAB — GLUCOSE TOLERANCE, 2 HOURS W/ 1HR
Glucose, 1 hour: 119 mg/dL (ref 70–179)
Glucose, 2 hour: 96 mg/dL (ref 70–152)
Glucose, Fasting: 94 mg/dL — ABNORMAL HIGH (ref 70–91)

## 2023-08-16 MED ORDER — GLUCOSE BLOOD VI STRP: ORAL_STRIP | 12 refills | Status: DC

## 2023-08-16 MED ORDER — ACCU-CHEK GUIDE W/DEVICE KIT: 1.0000 | PACK | Freq: Every day | 0 refills | Status: AC

## 2023-08-16 MED ORDER — ACCU-CHEK SOFTCLIX LANCETS MISC: 12 refills | Status: DC

## 2023-08-16 NOTE — Telephone Encounter (Signed)
 Notified patient on recent diagnosis of GDM. Patient notified of appointment for DM education on next available appointment on 08/23/23 at 3:15 PM. Supplies sent to pharmacy and informed patient to bring supplies to appointment. Patient voiced understanding and confirmed scheduled appointment.   Moira Andrews, RN

## 2023-08-17 ENCOUNTER — Ambulatory Visit: Payer: Self-pay | Admitting: Family Medicine

## 2023-08-17 ENCOUNTER — Other Ambulatory Visit: Payer: Self-pay | Admitting: Lactation Services

## 2023-08-17 DIAGNOSIS — O0990 Supervision of high risk pregnancy, unspecified, unspecified trimester: Secondary | ICD-10-CM

## 2023-08-17 DIAGNOSIS — O365990 Maternal care for other known or suspected poor fetal growth, unspecified trimester, not applicable or unspecified: Secondary | ICD-10-CM

## 2023-08-17 DIAGNOSIS — O24410 Gestational diabetes mellitus in pregnancy, diet controlled: Secondary | ICD-10-CM

## 2023-08-17 MED ORDER — GLUCOSE BLOOD VI STRP: ORAL_STRIP | 12 refills | Status: DC

## 2023-08-17 MED ORDER — METRONIDAZOLE 500 MG PO TABS: 500.0000 mg | ORAL_TABLET | Freq: Two times a day (BID) | ORAL | 0 refills | Status: AC

## 2023-08-17 MED ORDER — ACCU-CHEK SOFTCLIX LANCETS MISC: 12 refills | Status: AC

## 2023-08-17 NOTE — Progress Notes (Deleted)
 Needs a referral  Patient was seen for Gestational Diabetes on 08/23/2023  Start time *** and End time ***   Estimated due date: 11/20/2023; [redacted]w[redacted]d  Clinical: Medications: *** Medical History: *** Labs: OGTT fasting 94, 1 hour 119, 2 hour 96 on 08/15/2023 No results found for: HGBA1C   Dietary and Lifestyle History: Pt presents today ***.  Pt reports she lives with *** and cooking and shopping.  All Pt's questions were answered during this encounter.  Physical Activity: *** Stress: *** Sleep: ***  24 hr Recall:  First Meal:  *** Snack:  *** Second meal:  *** Snack:  *** Third meal:  *** Snack:  *** Beverages:  ***  NUTRITION INTERVENTION  Nutrition education (E-1) on the following topics:   Initial Follow-up  []  []  Definition of Gestational Diabetes []  []  Why dietary management is important in controlling blood glucose []  []  Effects each nutrient has on blood glucose levels []  []  Simple carbohydrates vs complex carbohydrates []  []  Fluid intake []  []  Creating a balanced meal plan []  []  Carbohydrate counting  []  []  When to check blood glucose levels []  []  Proper blood glucose monitoring techniques []  []  Effect of stress and stress reduction techniques  []  []  Exercise effect on blood glucose levels, appropriate exercise during pregnancy []  []  Importance of limiting caffeine and abstaining from alcohol and smoking []  []  Medications used for blood sugar control during pregnancy []  []  Hypoglycemia and rule of 15 []  []  Postpartum self care  Blood glucose monitor given: *** Lot # *** Exp: *** CBG: *** mg/dL  *** Patient has a meter prior to visit. Patient is *** testing pre breakfast and 2 hours after each meal. FBS: *** Postprandial: ***  Patient instructed to monitor glucose levels: FBS: 60 - <= 95 mg/dL; 2 hour: <= 879 mg/dL  Patient received handouts: Nutrition Diabetes and Pregnancy Carbohydrate Counting List Blood glucose log Snack ideas for diabetes  during pregnancy  Patient will be seen for follow-up as needed.

## 2023-08-17 NOTE — Progress Notes (Signed)
 Orders clarified at request of Pharmacy with number of times to check blood sugars daily.

## 2023-08-20 LAB — PANORAMA PRENATAL TEST FULL PANEL:PANORAMA TEST PLUS 5 ADDITIONAL MICRODELETIONS: FETAL FRACTION: 18.6

## 2023-08-23 ENCOUNTER — Other Ambulatory Visit: Payer: MEDICAID

## 2023-08-28 ENCOUNTER — Ambulatory Visit: Payer: MEDICAID

## 2023-08-28 ENCOUNTER — Other Ambulatory Visit: Payer: MEDICAID

## 2023-08-28 ENCOUNTER — Encounter: Payer: MEDICAID | Admitting: Advanced Practice Midwife

## 2023-08-28 DIAGNOSIS — O09522 Supervision of elderly multigravida, second trimester: Secondary | ICD-10-CM

## 2023-08-28 DIAGNOSIS — O24410 Gestational diabetes mellitus in pregnancy, diet controlled: Secondary | ICD-10-CM

## 2023-09-03 ENCOUNTER — Encounter (HOSPITAL_COMMUNITY): Payer: Self-pay | Admitting: Obstetrics and Gynecology

## 2023-09-03 ENCOUNTER — Inpatient Hospital Stay (HOSPITAL_COMMUNITY)
Admission: EM | Admit: 2023-09-03 | Discharge: 2023-09-03 | Disposition: A | Discharge status: Home / Self Care | Payer: MEDICAID | Attending: Obstetrics and Gynecology | Admitting: Obstetrics and Gynecology

## 2023-09-03 ENCOUNTER — Telehealth: Payer: Self-pay | Admitting: Family Medicine

## 2023-09-03 DIAGNOSIS — O26853 Spotting complicating pregnancy, third trimester: Secondary | ICD-10-CM | POA: Diagnosis present

## 2023-09-03 DIAGNOSIS — O368130 Decreased fetal movements, third trimester, not applicable or unspecified: Secondary | ICD-10-CM | POA: Diagnosis not present

## 2023-09-03 DIAGNOSIS — Z3A28 28 weeks gestation of pregnancy: Secondary | ICD-10-CM

## 2023-09-03 DIAGNOSIS — O09522 Supervision of elderly multigravida, second trimester: Secondary | ICD-10-CM

## 2023-09-03 DIAGNOSIS — O24410 Gestational diabetes mellitus in pregnancy, diet controlled: Secondary | ICD-10-CM

## 2023-09-03 LAB — WET PREP, GENITAL
Clue Cells Wet Prep HPF POC: NONE SEEN
Sperm: NONE SEEN
Trich, Wet Prep: NONE SEEN
WBC, Wet Prep HPF POC: >=10 — AB (ref <10)

## 2023-09-03 LAB — URINALYSIS, ROUTINE W REFLEX MICROSCOPIC
Bilirubin Urine: NEGATIVE
Glucose, UA: NEGATIVE mg/dL
Hgb urine dipstick: NEGATIVE
Ketones, ur: NEGATIVE mg/dL
Leukocytes,Ua: NEGATIVE
Nitrite: NEGATIVE
Protein, ur: NEGATIVE mg/dL
Specific Gravity, Urine: 1.004 — ABNORMAL LOW (ref 1.005–1.030)
pH: 6 (ref 5.0–8.0)

## 2023-09-03 MED ORDER — FLUCONAZOLE 150 MG PO TABS: 150.0000 mg | ORAL_TABLET | Freq: Once | ORAL | Status: DC

## 2023-09-03 MED ORDER — FLUCONAZOLE 150 MG PO TABS
150.0000 mg | ORAL_TABLET | Freq: Once | ORAL | 0 refills | Status: AC
Start: 2023-09-03 — End: 2023-09-03

## 2023-09-03 MED ORDER — METRONIDAZOLE 500 MG PO TABS: 500.0000 mg | ORAL_TABLET | Freq: Two times a day (BID) | ORAL | 0 refills | Status: DC

## 2023-09-03 NOTE — Telephone Encounter (Signed)
 Patient called to say she was bleeding. I noticed she had not been seen since her New OB appointment. She said she didn't know if she should go to the emergency room. I suggested she go to the MAU because we did not have any availabilities. She will need to reschedule her OB appointment.

## 2023-09-03 NOTE — Discharge Instructions (Signed)
 It was a pleasure taking care of you today.  I sent a prescription for your partner to your pharmacy as well as refill on your metronidazole .  You will take this twice daily for 7 days.  I recommend pelvic rest for at least 2 weeks.  I also sent treatment for yeast which she can take after you finish the metronidazole .  If your symptoms worsen or you have any other concerns please return for further evaluation.

## 2023-09-03 NOTE — MAU Provider Note (Signed)
 History     CSN: [id number]  Arrival date and time: 09/03/23 1904   None     Chief Complaint  Patient presents with   Vaginal Bleeding   HPI Patient presenting for vaginal bleeding in pregnancy.  Reports that she had intercourse on Saturday and has been bleeding for the last few days since the intercourse.  Of note had positive test for trichomonas on 6/18 and was treated but has been continuing to have intercourse with partner who has not been treated.  Reports that bleeding is just spotting but sometimes little heavier closer to a period.  No other symptoms.  She also reports the baby is moving less than normal but still reports at least 5 movements an hour or 10 in 2 hours.  OB History     Gravida  2   Para  1   Term  1   Preterm  0   AB  0   Living  1      SAB  0   IAB  0   Ectopic  0   Multiple  0   Live Births  1           Past Medical History:  Diagnosis Date   Bronchitis    History of kidney infection    Trichomonosis     Past Surgical History:  Procedure Laterality Date   CESAREAN SECTION N/A 09/01/2012   Procedure: CESAREAN SECTION;  Surgeon: Burnard VEAR Pate, MD;  Location: [facility];  Service: Obstetrics;  Laterality: N/A;    Family History  Problem Relation Age of Onset   Cancer Mother    Diabetes Mother    Hypertension Mother    Heart failure Mother    Mental illness Mother    Gout Father    Hypertension Father    Diabetes Maternal Grandmother    Stroke Maternal Grandmother    Hypertension Maternal Grandmother    Heart attack Maternal Grandfather    Kidney disease Maternal Grandfather    Alcohol abuse Maternal Grandfather    Hypertension Paternal Grandmother    Diabetes Paternal Grandmother    Alzheimer's disease Paternal Grandmother    Arthritis Paternal Grandmother    Cancer Maternal Aunt        lung    Social History   Tobacco Use   Smoking status: Former    Current packs/day: 1.00    Types: Cigarettes   Smokeless  tobacco: Never  Vaping Use   Vaping status: Never Used  Substance Use Topics   Alcohol use: Not Currently   Drug use: Not Currently    Types: Marijuana, Cocaine    Comment: not currently using    Allergies: No Known Allergies  Medications Prior to Admission  Medication Sig Dispense Refill Last Dose/Taking   Prenatal Vit-Fe Fumarate-FA (PRENATAL PLUS VITAMIN/MINERAL) 27-1 MG TABS Take 1 tablet by mouth daily. 30 tablet 11 09/03/2023   Accu-Chek Softclix Lancets lancets To check blood sugars 4 times a day, ( Fasting and 2 hours after the first bite of breakfast, lunch and dinner. 100 each 12    Blood Glucose Monitoring Suppl (ACCU-CHEK GUIDE) w/Device KIT 1 Device by Does not apply route daily. 1 kit 0    glucose blood test strip To check blood sugars 4 times a day, ( Fasting and 2 hours after the first bite of breakfast, lunch and dinner. 100 each 12     Review of Systems  Gastrointestinal:  Negative for abdominal pain.  Genitourinary:  Positive for vaginal bleeding and vaginal discharge.  All other systems reviewed and are negative.  Physical Exam   Blood pressure (!) 108/56, pulse 98, temperature 98.7 F (37.1 C), resp. rate 18, height 5' 1 (1.549 m), weight 71.7 kg, last menstrual period 02/13/2023.  Physical Exam Vitals and nursing note reviewed.  Constitutional:      Appearance: Normal appearance.  HENT:     Head: Normocephalic and atraumatic.     Nose: No congestion or rhinorrhea.  Eyes:     Extraocular Movements: Extraocular movements intact.  Cardiovascular:     Rate and Rhythm: Normal rate.  Pulmonary:     Effort: Pulmonary effort is normal.  Abdominal:     Palpations: Abdomen is soft.     Tenderness: There is no abdominal tenderness.  Genitourinary:    Comments: No bleeding noticed on pelvic exam, white patches covering cervix, cervical friability. Musculoskeletal:        General: Normal range of motion.     Cervical back: Normal range of motion.  Skin:     General: Skin is warm.     Capillary Refill: Capillary refill takes less than 2 seconds.  Neurological:     General: No focal deficit present.     Mental Status: She is alert.     Cranial Nerves: No cranial nerve deficit.  Psychiatric:        Mood and Affect: Mood normal.        Behavior: Behavior normal.     MAU Course  Procedures  MDM Wet prep GC/chlamydia NST   Assessment and Plan  Valerie Reyes is a 38 yo G2P1 @ [redacted]w[redacted]d presenting for vaginal bleeding in pregnancy.  Vaginal bleeding in menses Vaginal bleeding after intercourse.  She previously had trichomonas and has been having intercourse with partner who was not treated for the trichomonas.  Cervical friability as well as yeast infection appreciated.  Will retreat for trichomonas and treat for yeast.  Expedited partner therapy sent to patient's pharmacy for trichomonas.  Discussed tricked return precautions.  No further questions or concerns  Decreased fetal movement Patient reporting fetus is moving less than normal but still reports at least 5 movements in an hour or 10 in 2 hours.  Reports that today the movement has greatly improved but was decreased yesterday.  NST reassuring today.  Discussed kick counts and return precautions.  Valerie Reyes Valerie Reyes 09/03/2023, 8:33 PM

## 2023-09-03 NOTE — MAU Note (Signed)
 Valerie Reyes is a 38 y.o. at [redacted]w[redacted]d here in MAU reporting:  On Saturday after intercourse noticed some vag bleeding when she wiped put a pad on and some more came out . Reports pain in her upper right abd.  Fetal movement a little less than usual since Saturday.   LMP:  Onset of complaint: Saturday Pain score: 8-9 Vitals:   09/03/23 1938  BP: (!) 108/56  Pulse: 98  Resp: 18  Temp: 98.7 F (37.1 C)     FHT: 153  Lab orders placed from triage: u/a

## 2023-09-04 ENCOUNTER — Ambulatory Visit: Payer: Self-pay | Admitting: Obstetrics and Gynecology

## 2023-09-04 LAB — GC/CHLAMYDIA PROBE AMP (~~LOC~~) NOT AT ARMC
Chlamydia: NEGATIVE
Comment: NEGATIVE
Comment: NORMAL
Neisseria Gonorrhea: NEGATIVE

## 2023-09-05 ENCOUNTER — Telehealth: Payer: Self-pay | Admitting: *Deleted

## 2023-09-05 DIAGNOSIS — O09529 Supervision of elderly multigravida, unspecified trimester: Secondary | ICD-10-CM

## 2023-09-05 DIAGNOSIS — O24419 Gestational diabetes mellitus in pregnancy, unspecified control: Secondary | ICD-10-CM

## 2023-09-05 DIAGNOSIS — O0990 Supervision of high risk pregnancy, unspecified, unspecified trimester: Secondary | ICD-10-CM

## 2023-09-05 NOTE — Telephone Encounter (Addendum)
 Received message patient had MAU visit for bleeding and needs follow up ob visit and follow up call.  OB visit has been scheduled by front office for 09/11/23. I called patient and she reports she is not having any bleeding. She reports baby is moving good. She reports she is having cramping pain occasionally. She reports she has not started the flagyl  yet but plans to today. We discussed if cramps/pain becomes severe to go to MAU again. She also asked if she has US  appointment and I informed her I do not see an appointment but I will schedule for asap and she confirmed she can see appointments in MyChart. I also reviewed her ob appointment date/ time with her. She voices understanding. I called MFM to schedule US  and was unable to reach anyone. Message sent to MFM clerical pool to schedule US .  Rock Skip PEAK

## 2023-09-11 ENCOUNTER — Encounter: Payer: Self-pay | Admitting: Family Medicine

## 2023-09-11 ENCOUNTER — Ambulatory Visit (INDEPENDENT_AMBULATORY_CARE_PROVIDER_SITE_OTHER): Payer: MEDICAID | Admitting: Advanced Practice Midwife

## 2023-09-11 ENCOUNTER — Other Ambulatory Visit: Payer: Self-pay

## 2023-09-11 ENCOUNTER — Other Ambulatory Visit (HOSPITAL_COMMUNITY)
Admission: RE | Admit: 2023-09-11 | Discharge: 2023-09-11 | Disposition: A | Discharge status: Home / Self Care | Payer: MEDICAID | Source: Ambulatory Visit | Attending: Family Medicine | Admitting: Family Medicine

## 2023-09-11 VITALS — BP 106/64 | HR 112 | Wt 160.9 lb

## 2023-09-11 DIAGNOSIS — O24410 Gestational diabetes mellitus in pregnancy, diet controlled: Secondary | ICD-10-CM

## 2023-09-11 DIAGNOSIS — O99320 Drug use complicating pregnancy, unspecified trimester: Secondary | ICD-10-CM

## 2023-09-11 DIAGNOSIS — F4310 Post-traumatic stress disorder, unspecified: Secondary | ICD-10-CM

## 2023-09-11 DIAGNOSIS — O0990 Supervision of high risk pregnancy, unspecified, unspecified trimester: Secondary | ICD-10-CM

## 2023-09-11 DIAGNOSIS — O09523 Supervision of elderly multigravida, third trimester: Secondary | ICD-10-CM | POA: Diagnosis not present

## 2023-09-11 DIAGNOSIS — F1990 Other psychoactive substance use, unspecified, uncomplicated: Secondary | ICD-10-CM

## 2023-09-11 DIAGNOSIS — O99343 Other mental disorders complicating pregnancy, third trimester: Secondary | ICD-10-CM

## 2023-09-11 DIAGNOSIS — Z3A30 30 weeks gestation of pregnancy: Secondary | ICD-10-CM

## 2023-09-11 DIAGNOSIS — Z654 Victim of crime and terrorism: Secondary | ICD-10-CM

## 2023-09-11 DIAGNOSIS — Z8619 Personal history of other infectious and parasitic diseases: Secondary | ICD-10-CM | POA: Diagnosis present

## 2023-09-11 DIAGNOSIS — O0993 Supervision of high risk pregnancy, unspecified, third trimester: Secondary | ICD-10-CM

## 2023-09-11 DIAGNOSIS — O99323 Drug use complicating pregnancy, third trimester: Secondary | ICD-10-CM

## 2023-09-11 DIAGNOSIS — Z98891 History of uterine scar from previous surgery: Secondary | ICD-10-CM

## 2023-09-11 DIAGNOSIS — F32A Depression, unspecified: Secondary | ICD-10-CM

## 2023-09-11 DIAGNOSIS — F1490 Cocaine use, unspecified, uncomplicated: Secondary | ICD-10-CM
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
## 2023-09-11 NOTE — Patient Instructions (Signed)

## 2023-09-12 LAB — CERVICOVAGINAL ANCILLARY ONLY
Comment: NEGATIVE
Trichomonas: NEGATIVE

## 2023-09-14 LAB — TOXASSURE FLEX 15, UR
6-ACETYLMORPHINE IA: NEGATIVE ng/mL
7-aminoclonazepam: NOT DETECTED ng/mg{creat}
AMPHETAMINES IA: NEGATIVE ng/mL
Alpha-hydroxyalprazolam: NOT DETECTED ng/mg{creat}
Alpha-hydroxymidazolam: NOT DETECTED ng/mg{creat}
Alpha-hydroxytriazolam: NOT DETECTED ng/mg{creat}
Alprazolam: NOT DETECTED ng/mg{creat}
BARBITURATES IA: NEGATIVE ng/mL
BUPRENORPHINE: NEGATIVE
Benzodiazepines: NEGATIVE
Buprenorphine: NOT DETECTED ng/mg{creat}
COCAINE METABOLITE IA: NEGATIVE ng/mL
Clonazepam: NOT DETECTED ng/mg{creat}
Creatinine: 47 mg/dL (ref >=20)
Desalkylflurazepam: NOT DETECTED ng/mg{creat}
Desmethyldiazepam: NOT DETECTED ng/mg{creat}
Desmethylflunitrazepam: NOT DETECTED ng/mg{creat}
Diazepam: NOT DETECTED ng/mg{creat}
ETHYL ALCOHOL Enzymatic: NEGATIVE g/dL
FENTANYL: NEGATIVE
Fentanyl: NOT DETECTED ng/mg{creat}
Flunitrazepam: NOT DETECTED ng/mg{creat}
Lorazepam: NOT DETECTED ng/mg{creat}
METHADONE IA: NEGATIVE ng/mL
METHADONE MTB IA: NEGATIVE ng/mL
Midazolam: NOT DETECTED ng/mg{creat}
Norbuprenorphine: NOT DETECTED ng/mg{creat}
Norfentanyl: NOT DETECTED ng/mg{creat}
OPIATE CLASS IA: NEGATIVE ng/mL
OXYCODONE CLASS IA: NEGATIVE ng/mL
Oxazepam: NOT DETECTED ng/mg{creat}
PHENCYCLIDINE IA: NEGATIVE ng/mL
TAPENTADOL, IA: NEGATIVE ng/mL
TRAMADOL IA: NEGATIVE ng/mL
Temazepam: NOT DETECTED ng/mg{creat}

## 2023-09-14 LAB — CANNABINOIDS, MS, UR RFX
Cannabinoids Confirmation: POSITIVE
Carboxy-THC: 236 ng/mg{creat}

## 2023-09-16 ENCOUNTER — Encounter: Payer: Self-pay | Admitting: Advanced Practice Midwife

## 2023-09-16 ENCOUNTER — Ambulatory Visit: Payer: Self-pay | Admitting: Advanced Practice Midwife

## 2023-09-16 DIAGNOSIS — A5901 Trichomonal vulvovaginitis: Secondary | ICD-10-CM | POA: Insufficient documentation

## 2023-09-18 ENCOUNTER — Encounter: Payer: MEDICAID | Admitting: Family Medicine

## 2023-09-24 ENCOUNTER — Encounter: Payer: Self-pay | Admitting: Advanced Practice Midwife

## 2023-09-24 DIAGNOSIS — D219 Benign neoplasm of connective and other soft tissue, unspecified: Secondary | ICD-10-CM | POA: Insufficient documentation

## 2023-09-24 DIAGNOSIS — T7491XA Unspecified adult maltreatment, confirmed, initial encounter: Secondary | ICD-10-CM | POA: Insufficient documentation

## 2023-09-25 ENCOUNTER — Encounter: Payer: Self-pay | Admitting: Advanced Practice Midwife

## 2023-09-25 ENCOUNTER — Telehealth: Payer: MEDICAID | Admitting: Advanced Practice Midwife

## 2023-09-25 DIAGNOSIS — Z3A32 32 weeks gestation of pregnancy: Secondary | ICD-10-CM

## 2023-09-25 DIAGNOSIS — O24414 Gestational diabetes mellitus in pregnancy, insulin controlled: Secondary | ICD-10-CM

## 2023-09-25 DIAGNOSIS — O99343 Other mental disorders complicating pregnancy, third trimester: Secondary | ICD-10-CM

## 2023-09-25 DIAGNOSIS — O0993 Supervision of high risk pregnancy, unspecified, third trimester: Secondary | ICD-10-CM | POA: Diagnosis not present

## 2023-09-25 DIAGNOSIS — F1990 Other psychoactive substance use, unspecified, uncomplicated: Secondary | ICD-10-CM | POA: Diagnosis not present

## 2023-09-25 DIAGNOSIS — O0990 Supervision of high risk pregnancy, unspecified, unspecified trimester: Secondary | ICD-10-CM

## 2023-09-25 DIAGNOSIS — O99342 Other mental disorders complicating pregnancy, second trimester: Secondary | ICD-10-CM

## 2023-09-25 DIAGNOSIS — F32A Depression, unspecified: Secondary | ICD-10-CM

## 2023-09-25 DIAGNOSIS — O09523 Supervision of elderly multigravida, third trimester: Secondary | ICD-10-CM | POA: Diagnosis not present

## 2023-09-25 MED ORDER — INSULIN PEN NEEDLE 33G X 4 MM MISC: 1.0000 | Freq: Four times a day (QID) | 11 refills | Status: DC

## 2023-09-25 MED ORDER — INSULIN GLARGINE 100 UNIT/ML SOLOSTAR PEN: 10.0000 [IU] | PEN_INJECTOR | Freq: Every day | SUBCUTANEOUS | 11 refills | Status: DC
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
## 2023-09-26 ENCOUNTER — Encounter: Payer: MEDICAID | Attending: Advanced Practice Midwife | Admitting: Nutrition

## 2023-09-26 DIAGNOSIS — O24414 Gestational diabetes mellitus in pregnancy, insulin controlled: Secondary | ICD-10-CM | POA: Insufficient documentation

## 2023-09-26 NOTE — Patient Instructions (Addendum)
 Give 10u of Lantus  insulin  every night before bed.   Call MD if blood sugars remain over 110 in the morning before breakfast. Stop drinking juices, sweet drinks and no cold cereal and milk. Make sure to have some sort of protein with every meal.  Call office if questions  [phone]

## 2023-09-26 NOTE — Progress Notes (Signed)
 Patient was trained on the use of the Lantus  insulin  pen.  We discussed the timing of this insulin , storage,  and how to draw up the dose of 10u, and sites to use for insulin  injections, and how to administer the injection. She re demonstrated attaching the needles correctly, doing an air shot, dialing the correct dose: 10u, but could not inject the needle.  This was done by me, but no insulin  was injected.  She reported no pain, or discomfort, and felt that she could do this tonight.  She reports that her mother also takes this insulin .   We reviewed low blood sugar symptoms and treatments and she reported good understanding of this.   Patient is drinking a lot of juice.  Discussed that she should not be drinking juices, but that it was ok to eat fruit with meals, but no juice, sweet drinks and cold cereal and milk.  She agreed not to do this and had no final questions.  I will call her tomorrow to confirm that she took her dose.

## 2023-09-29 ENCOUNTER — Telehealth: Payer: Self-pay | Admitting: Nutrition

## 2023-09-29 NOTE — Telephone Encounter (Signed)
 Patient reported no difficulty in drawing up the 10u of lantus  insulin  and in giving the injection last night.  Says FBS this morning was 86.  She had no questions for me at this time.

## 2023-10-08 NOTE — BH Specialist Note (Deleted)
 Integrated Behavioral Health via Telemedicine Visit  10/08/2023 Valerie Reyes [id number]  Number of Integrated Behavioral Health Clinician visits: No data recorded Session Start time: No data recorded  Session End time: No data recorded Total time in minutes: No data recorded   Referring Provider: [place]  Claudene, CNM Patient/Family location: Home*** [facility] Provider location: Center for [facility] at [facility]  All persons participating in visit: Patient Valerie Reyes and [facility] Elmar [place]ig[place] ***  Types of Service: {CHL AMB TYPE OF [phone]}  I connected with Krystel Reyes and/or Carinne Diggins's {family members:20773} via  Telephone or Engineer, civil (consulting)  (Video is Surveyor, mining) and verified that I am speaking with the correct person using two identifiers. Discussed confidentiality: Yes   I discussed the limitations of telemedicine and the availability of in person appointments.  Discussed there is a possibility of technology failure and discussed alternative modes of communication if that failure occurs.  I discussed that engaging in this telemedicine visit, they consent to the provision of behavioral healthcare and the services will be billed under their insurance.  Patient and/or legal guardian expressed understanding and consented to Telemedicine visit: Yes   Presenting Concerns: Patient and/or family reports the following symptoms/concerns: *** Duration of problem: ***; Severity of problem: {Mild/Moderate/Severe:20260}  Patient and/or Family's Strengths/Protective Factors: {CHL AMB BH PROTECTIVE [phone]}  Goals Addressed: Patient will:  Reduce symptoms of: {IBH Symptoms:21014056}   Increase knowledge and/or ability of: {IBH Patient Tools:21014057}   Demonstrate ability to: {IBH Goals:21014053}  Progress towards Goals: {CHL AMB BH PROGRESS TOWARDS  GOALS:[phone]}    Interventions: Interventions utilized:  {IBH Interventions:21014054} Standardized Assessments completed: {IBH Screening Tools:21014051}    Patient and/or Family Response: Patient agrees with treatment plan.   Clinical Assessment/Diagnosis  No diagnosis found.    Patient may benefit from psychoeducation and brief therapeutic interventions regarding coping with symptoms of *** .  Plan: Follow up with behavioral health clinician on : *** Behavioral recommendations:  -*** -*** Referral(s): {IBH Referrals:21014055}  I discussed the assessment and treatment plan with the patient and/or parent/guardian. They were provided an opportunity to ask questions and all were answered. They agreed with the plan and demonstrated an understanding of the instructions.   They were advised to call back or seek an in-person evaluation if the symptoms worsen or if the condition fails to improve as anticipated.  Warren BROCKS Dimonique Bourdeau, LCSW     08/15/2023    5:00 PM 05/10/2016    1:52 PM 04/25/2016    1:53 PM  Depression screen PHQ 2/9  Decreased Interest 3 0 0  Down, Depressed, Hopeless 2 0 0  PHQ - 2 Score 5 0 0  Altered sleeping 3    Tired, decreased energy 3    Change in appetite 2    Feeling bad or failure about yourself  3    Trouble concentrating 3    Moving slowly or fidgety/restless 2    Suicidal thoughts 2    PHQ-9 Score 23        08/15/2023    5:00 PM  GAD 7 : Generalized Anxiety Score  Nervous, Anxious, on Edge 3  Control/stop worrying 2  Worry too much - different things 3  Trouble relaxing 2  Restless 2  Easily annoyed or irritable 3  Afraid - awful might happen 2  Total GAD 7 Score 17

## 2023-10-09 ENCOUNTER — Encounter: Payer: MEDICAID | Admitting: Family Medicine

## 2023-10-09 ENCOUNTER — Telehealth: Payer: Self-pay

## 2023-10-09 NOTE — Telephone Encounter (Signed)
 Spoke with pt in regards to today's missed appointment. Offered pt a virtual appointment for today since she is having transportation issues. Pt states she would just like to reschedule due to police offices just leaving her house. Pt then asked a question regarding her recent antibiotics she was prescribed. She stated she is still having discharge that is yellow-green but now in addition she is having bright red bleeding that she feels is because of the yeast. Advised pt that you do not get bleeding from a yeast infection so I would advise she go to the maternity assessment unit at the hospital to be checked out. Pt agreeable to this and is going to try and find a ride. Let pt know she can call us  back with anymore questions or concerns. Pt voiced understanding of all information and denies any further questions or concerns.   Cyndee Molt, RN
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
## 2023-10-12 ENCOUNTER — Ambulatory Visit (HOSPITAL_BASED_OUTPATIENT_CLINIC_OR_DEPARTMENT_OTHER): Payer: MEDICAID

## 2023-10-12 ENCOUNTER — Other Ambulatory Visit: Payer: Self-pay | Admitting: Family Medicine

## 2023-10-12 ENCOUNTER — Other Ambulatory Visit: Payer: Self-pay | Admitting: *Deleted

## 2023-10-12 ENCOUNTER — Ambulatory Visit (INDEPENDENT_AMBULATORY_CARE_PROVIDER_SITE_OTHER): Payer: MEDICAID | Admitting: Clinical

## 2023-10-12 ENCOUNTER — Ambulatory Visit: Payer: MEDICAID | Attending: Obstetrics and Gynecology | Admitting: Obstetrics and Gynecology

## 2023-10-12 VITALS — BP 111/74 | HR 77

## 2023-10-12 DIAGNOSIS — O365930 Maternal care for other known or suspected poor fetal growth, third trimester, not applicable or unspecified: Secondary | ICD-10-CM

## 2023-10-12 DIAGNOSIS — O09523 Supervision of elderly multigravida, third trimester: Secondary | ICD-10-CM

## 2023-10-12 DIAGNOSIS — O24414 Gestational diabetes mellitus in pregnancy, insulin controlled: Secondary | ICD-10-CM | POA: Diagnosis not present

## 2023-10-12 DIAGNOSIS — F4310 Post-traumatic stress disorder, unspecified: Secondary | ICD-10-CM

## 2023-10-12 DIAGNOSIS — O0990 Supervision of high risk pregnancy, unspecified, unspecified trimester: Secondary | ICD-10-CM

## 2023-10-12 DIAGNOSIS — Z3A34 34 weeks gestation of pregnancy: Secondary | ICD-10-CM | POA: Diagnosis not present

## 2023-10-12 DIAGNOSIS — O99323 Drug use complicating pregnancy, third trimester: Secondary | ICD-10-CM | POA: Insufficient documentation

## 2023-10-12 DIAGNOSIS — O3413 Maternal care for benign tumor of corpus uteri, third trimester: Secondary | ICD-10-CM | POA: Diagnosis not present

## 2023-10-12 DIAGNOSIS — F319 Bipolar disorder, unspecified: Secondary | ICD-10-CM | POA: Diagnosis not present

## 2023-10-12 DIAGNOSIS — Z658 Other specified problems related to psychosocial circumstances: Secondary | ICD-10-CM

## 2023-10-12 DIAGNOSIS — O34219 Maternal care for unspecified type scar from previous cesarean delivery: Secondary | ICD-10-CM | POA: Insufficient documentation

## 2023-10-12 DIAGNOSIS — F1910 Other psychoactive substance abuse, uncomplicated: Secondary | ICD-10-CM | POA: Insufficient documentation

## 2023-10-12 DIAGNOSIS — O24419 Gestational diabetes mellitus in pregnancy, unspecified control: Secondary | ICD-10-CM

## 2023-10-12 DIAGNOSIS — O365990 Maternal care for other known or suspected poor fetal growth, unspecified trimester, not applicable or unspecified: Secondary | ICD-10-CM | POA: Insufficient documentation

## 2023-10-12 DIAGNOSIS — O0993 Supervision of high risk pregnancy, unspecified, third trimester: Secondary | ICD-10-CM | POA: Insufficient documentation

## 2023-10-12 DIAGNOSIS — D259 Leiomyoma of uterus, unspecified: Secondary | ICD-10-CM | POA: Insufficient documentation

## 2023-10-12 DIAGNOSIS — Z363 Encounter for antenatal screening for malformations: Secondary | ICD-10-CM | POA: Insufficient documentation

## 2023-10-12 DIAGNOSIS — F909 Attention-deficit hyperactivity disorder, unspecified type: Secondary | ICD-10-CM

## 2023-10-12 NOTE — Progress Notes (Signed)
 Maternal-Fetal Medicine Consultation Name: Valerie Reyes MRN: [id number]  G2 P1001 at 34w 3d gestation.  Her high risk problems include: - Gestational diabetes.  Patient reports she takes Lantus  insulin  10 units at night.  She reports her fasting levels range between 80-90 mg/dL and postprandial levels between 100 and 113 mg/dL. - Advanced maternal age.  On cell-free fetal DNA screening, the risks of fetal aneuploidies are not increased. - Previous cesarean delivery.  Ultrasound On today's ultrasound, the estimated fetal weight is at the 4th percentile and the abdominal circumference measurement at the 14 percentile.  Amniotic fluid is normal good fetal activity seen.  Fetal anatomical survey appears normal but limited by advanced gestational age. Umbilical artery Doppler showed normal forward diastolic flow.  Antenatal testing is reassuring.  BPP 8/8.  Cephalic presentation.  Our concerns include: Fetal growth restriction I explained the finding of fetal growth restriction that is difficult to differentiate from a constitutionally small for gestational age fetus. Her previous baby weighed 8 lbs 3 oz at birth. Most fetuses are small but not growth restricted.  I discussed the possible causes of fetal growth restriction including placental insufficiency (most common cause).  I discussed ultrasound protocol of weekly monitoring in pregnancies complicated by fetal growth restriction. Timing of delivery: Since small for gestational age fetuses have a higher chance of having perinatal mortality and morbidity, and the patient has diabetes on insulin , delivery at 47 to [redacted] weeks gestation is reasonable.  If, however, severe fetal growth restriction is seen with normal antenatal testing, we will recommend delivery at [redacted] weeks gestation.  Gestational diabetes I discussed normal blood glucose parameters and the importance of weekly antenatal testing.  Provider diabetes is well-controlled and she does  not have severe fetal growth restriction, patient can be delivered at [redacted] weeks gestation.  Recommendations -Continue weekly antenatal testing till delivery.   Consultation including face-to-face (more than 50%) counseling 30 minutes.

## 2023-10-12 NOTE — Patient Instructions (Addendum)
 Center for [facility] at [facility] [address] [place], [place] [address] [phone] (main office) [phone] [facility] office)  New Parent Support Groups www.[email or web address].net [email or web address]  [facility]  [address], [place], [place] [address] [phone] or [phone] [facility] 24/7 FOR ANYONE [address], [place], [place]  [phone] Fax: [phone] [email or web address] *Interpreters available *Accepts all insurance and uninsured for Urgent Care needs *Accepts Medicaid and uninsured for outpatient treatment (below)    ONLY FOR [facility]  Below:   Outpatient New Patient Assessment/Therapy Walk-ins:        Monday -Thursday 8am until slots are full.        Every Friday 1pm-4pm  (first come, first served)                   New Patient Psychiatry/Medication Management        Monday-Friday 8am-11am (first come, first served)              For all walk-ins we ask that you arrive by 7:15am, because patients will be seen in the order of arrival.       BRAINSTORMING  Develop a Plan Goals: Provide a way to start conversation about your new life with a baby Assist parents in recognizing and using resources within their reach Help pave the way before birth for an easier period of transition afterwards.  Make a list of the following information to keep in a central location: Full name of Mom and Partner: _____________________________________________ Baby's full name and Date of Birth: ___________________________________________ Home Address: ___________________________________________________________ ________________________________________________________________________ Home Phone: ____________________________________________________________ Parents' cell numbers:  _____________________________________________________ ________________________________________________________________________ Name and contact info for OB: ______________________________________________ Name and contact info for Pediatrician:________________________________________ Contact info for Lactation Consultants: ________________________________________  REST and SLEEP *You each need at least 4-5 hours of uninterrupted sleep every day. Write specific names and contact information.* How are you going to rest in the postpartum period? While partner's home? When partner returns to work? When you both return to work? Where will your baby sleep? Who is available to help during the day? Evening? Night? Who could move in for a period to help support you? What are some ideas to help you get enough sleep? __________________________________________________________________________________________________________________________________________________________________________________________________________________________________________ NUTRITIOUS FOOD AND DRINK *Plan for meals before your baby is born so you can have healthy food to eat during the immediate postpartum period.* Who will look after breakfast? Lunch? Dinner? List names and contact information. Brainstorm quick, healthy ideas for each meal. What can you do before baby is born to prepare meals for the postpartum period? How can others help you with meals? Which grocery stores provide online shopping and delivery? Which restaurants offer take-out or delivery  options? ______________________________________________________________________________________________________________________________________________________________________________________________________________________________________________________________________________________________________________________________________________________________________________________________________  CARE FOR MOM *It's important that mom is cared for and pampered in the postpartum period. Remember, the most important ways new mothers need care are: sleep, nutrition, gentle exercise, and time off.* Who can come take care of mom during this period? Make a list of people with their contact information. List some activities that make you feel cared for, rested, and energized? Who can make sure you have opportunities to do these things? Does mom have a space of her very own within your home that's just for her? Make a "[facility]" where she can be comfortable, rest, and renew herself daily. ______________________________________________________________________________________________________________________________________________________________________________________________________________________________________________________________________________________________________________________________________________________________________________________________________    CARE FOR AND FEEDING BABY *Knowledgeable and encouraging people will offer the best support with regard to feeding your baby.* Educate  Educate yourself and choose the best feeding option for your baby. Make a list of people who will guide, support, and be a resource for you as your care for and feed your baby. (Friends that have breastfed or are currently breastfeeding, lactation consultants, breastfeeding support groups, etc.) Consider a postpartum doula. (These websites can give you information: dona.org & https://shea.org/) Seek out local  breastfeeding resources like the breastfeeding support group at [employer] or [employer]. ______________________________________________________________________________________________________________________________________________________________________________________________________________________________________________________________________________________________________________________________________________________________________________________________________  GAITHER AND ERRANDS Who can help with a thorough cleaning before baby is born? Make a list of people who will help with housekeeping and chores, like laundry, light cleaning, dishes, bathrooms, etc. Who can run some errands for you? What can you do to make sure you are stocked with basic supplies before baby is born? Who is going to do the shopping? ______________________________________________________________________________________________________________________________________________________________________________________________________________________________________________________________________________________________________________________________________________________________________________________________________     Family Adjustment *Nurture yourselves.it helps parents be more loving and allows for better bonding with their child.* What sorts of things do you and partner enjoy doing together? Which activities help you to connect and strengthen your relationship? Make a list of those things. Make a list of people whom you trust to care for your baby so you can have some time together as a couple. What types of things help partner feel connected to Mom? Make a list. What needs will partner have in order to bond with baby? Other children? Who will care for them when you go into labor and while you are in the hospital? Think about what the needs of your older children might be. Who can help you meet those  needs? In what ways are you helping them prepare for bringing baby home? List some specific strategies you have for family adjustment. _______________________________________________________________________________________________________________________________________________________________________________________________________________________________________________________________________________________________________________________________________________  SUPPORT *Someone who can empathize with experiences normalizes your problems and makes them more bearable.* Make a list of other friends, neighbors, and/or co-workers you know with infants (and small children, if applicable) with whom you can connect. Make a list of local or online support groups, mom groups, etc. in which you can be involved. ______________________________________________________________________________________________________________________________________________________________________________________________________________________________________________________________________________________________________________________________________________________________________________________________________  Childcare Plans Investigate and plan for childcare if mom is returning to work. Talk about mom's concerns about her transition back to work. Talk about partner's concerns regarding this transition.  Mental Health *Your mental health is one of the highest priorities for a pregnant or postpartum mom.* 1 in 5 women experience anxiety and/or depression from the time of conception through the first year after birth. Postpartum Mood Disorders are the #1 complication of pregnancy and childbirth and the suffering experienced by these mothers is not necessary! These illnesses are temporary and respond well to treatment, which often includes self-care, social support, talk therapy, and medication when needed. Women  experiencing anxiety and depression often say things like: "I'm supposed to be happy.why do I feel so sad?", "Why can't I snap out of it?", "I'm having thoughts that scare me." There is no need to be embarrassed if you are feeling these symptoms: Overwhelmed, anxious, angry, sad, guilty, irritable, hopeless, exhausted but can't sleep You are NOT alone. You are NOT to blame. With help, you WILL be well. Where can I find help? Medical professionals such as your OB, midwife, gynecologist, family practitioner, primary care provider, pediatrician, or mental health providers; [facility] support groups: Feelings After Birth, Breastfeeding Support Group, Baby and Me Group, and Fit 4 Two exercise classes. You have permission to ask for help. It will confirm your feelings, validate your experiences, share/learn coping  and gain support and encouragement as you heal. You are important! BRAINSTORM Make a list of local resources, including resources for mom and for partner. Identify support groups. Identify people to call late at night - include names and contact info. Talk with partner about perinatal mood and anxiety disorders. Talk with your OB, midwife, and doula about baby blues and about perinatal mood and anxiety disorders. Talk with your pediatrician about perinatal mood and anxiety disorders.   Support & Sanity Savers   What do you really need?  Basics In preparing for a new baby, many expectant parents spend hours shopping for baby clothes, decorating the nursery, and deciding which car seat to buy. Yet most don't think much about what the reality of parenting a newborn will be like, and what they need to make it through that. So, here is the advice of experienced parents. We know you'll read this, and think "they're exaggerating, I don't really need that." Just trust us  on these, OK? Plan for all of this, and if it turns out you don't need it, come back and teach us  how you did  it!  Must-Haves (Once baby's survival needs are met, make sure you attend to your own survival needs!) Sleep An average newborn sleeps 16-18 hours per day, over 6-7 sleep periods, rarely more than three hours at a time. It is normal and healthy for a newborn to wake throughout the night... but really hard on parents!! Naps. Prioritize sleep above any responsibilities like: cleaning house, visiting friends, running errands, etc.  Sleep whenever baby sleeps. If you can't nap, at least have restful times when baby eats. The more rest you get, the more patient you will be, the more emotionally stable, and better at solving problems.  Food You may not have realized it would be difficult to eat when you have a newborn. Yet, when we talk to countless new parents, they say things like "it may be 2:00 pm when I realize I haven't had breakfast yet." Or "every time we sit down to dinner, baby needs to eat, and my food gets cold, so I don't bother to eat it." Finger food. Before your baby is born, stock up with one months' worth of food that: 1) you can eat with one hand while holding a baby, 2) doesn't need to be prepped, 3) is good hot or cold, 4) doesn't spoil when left out for a few hours, and 5) you like to eat. Think about: nuts, dried fruit, Clif bars, pretzels, jerky, gogurt, baby carrots, apples, bananas, crackers, cheez-n-crackers, string cheese, hot pockets or frozen burritos to microwave, garden burgers and breakfast pastries to put in the toaster, yogurt drinks, etc. Restaurant Menus. Make lists of your favorite restaurants & menu items. When family/friends want to help, you can give specific information without much thought. They can either bring you the food or send gift cards for just the right meals. Freezer Meals.  Take some time to make a few meals to put in the freezer ahead of time.  Easy to freeze meals can be anything such as soup, lasagna, chicken pie, or spaghetti sauce. Set up a Meal  Schedule.  Ask friends and family to sign up to bring you meals during the first few weeks of being home. (It can be passed around at baby showers!) You have no idea how helpful this will be until you are in the throes of parenting.  MachineLive.it is a great website to check out. Emotional Support Know  who to call when you're stressed out. Parenting a newborn is very challenging work. There are times when it totally overwhelms your normal coping abilities. EVERY NEW PARENT NEEDS TO HAVE A PLAN FOR WHO TO CALL WHEN THEY JUST CAN'T COPE ANY MORE. (And it has to be someone other than the baby's other parent!) Before your baby is born, come up with at least one person you can call for support - write their phone number down and post it on the refrigerator. Anxiety & Sadness. Baby blues are normal after pregnancy; however, there are more severe types of anxiety & sadness which can occur and should not be ignored.  They are always treatable, but you have to take the first step by reaching out for help. [facility] offers a "Mom Talk" group which meets every Tuesday from 10 am - 11 am.  This group is for new moms who need support and connection after their babies are born.  Call [phone].  Really, Really Helpful (Plan for them! Make sure these happen often!!) Physical Support with Taking Care of Yourselves Asking friends and family. Before your baby is born, set up a schedule of people who can come and visit and help out (or ask a friend to schedule for you). Any time someone says "let me know what I can do to help," sign them up for a day. When they get there, their job is not to take care of the baby (that's your job and your joy). Their job is to take care of you!  Postpartum doulas. If you don't have anyone you can call on for support, look into postpartum doulas:  professionals at helping parents with caring for baby, caring for themselves, getting breastfeeding started, and helping with  household tasks. www.padanc.org is a helpful website for learning about doulas in our area. Peer Support / Parent Groups Why: One of the greatest ideas for new parents is to be around other new parents. Parent groups give you a chance to share and listen to others who are going through the same season of life, get a sense of what is normal infant development by watching several babies learn and grow, share your stories of triumph and struggles with empathetic ears, and forgive your own mistakes when you realize all parents are learning by trial and error. Where to find: There are many places you can meet other new parents throughout our community.  [facility] offers the following classes for new moms and their little ones:  Baby and Me (Birth to Crawling) and Breastfeeding Support Group. Go to www.conehealthybaby.com or call [phone] for more information. Time for your Relationship It's easy to get so caught up in meeting baby's immediate needs that it's hard to find time to connect with your partner, and meet the needs of your relationship. It's also easy to forget what "quality time with your partner" actually looks like. If you take your baby on a date, you'd be amazed how much of your couple time is spent feeding the baby, diapering the baby, admiring the baby, and talking about the baby. Dating: Try to take time for just the two of you. Babysitter tip: Sometimes when moms are breastfeeding a newborn, they find it hard to figure out how to schedule outings around baby's unpredictable feeding schedules. Have the babysitter come for a three hour period. When she comes over, if baby has just eaten, you can leave right away, and come back in two hours. If baby hasn't fed recently,  you start the date at home. Once baby gets hungry and gets a good feeding in, you can head out for the rest of your date time. Date Nights at Home: If you can't get out, at least set aside one evening a week to prioritize  your relationship: whenever baby dozes off or doesn't have any immediate needs, spend a little time focusing on each other. Potential conflicts: The main relationship conflicts that come up for new parents are: issues related to sexuality, financial stresses, a feeling of an unfair division of household tasks, and conflicts in parenting styles. The more you can work on these issues before baby arrives, the better!  Fun and Frills (Don't forget these. and don't feel guilty for indulging in them!) Everyone has something in life that is a fun little treat that they do just for themselves. It may be: reading the morning paper, or going for a daily jog, or having coffee with a friend once a week, or going to a movie on Friday nights, or fine chocolates, or bubble baths, or curling up with a good book. Unless you do fun things for yourself every now and then, it's hard to have the energy for fun with your baby. Whatever your "special" treats are, make sure you find a way to continue to indulge in them after your baby is born. These special moments can recharge you, and allow you to return to baby with a new joy   PERINATAL MOOD DISORDERS: MATERNAL MENTAL HEALTH FROM CONCEPTION THROUGH THE POSTPARTUM PERIOD   _________________________________________Emergency and Crisis Resources If you are an imminent risk to self or others, are experiencing intense personal distress, and/or have noticed significant changes in activities of daily living, call:  911 [facility]: [phone]  [address] [place], [place], [address] Mobile Crisis: 203-447-5111 National Suicide Hotline: 66 Or visit the following crisis centers: Local Emergency Departments [facility]:  [address] [place]  Mon-Friday 8am-3pm, [phone]                                                                                  ___________ Non-Crisis Resources To identify specific providers that are covered by your  insurance, contact your insurance company or local agencies:   Postpartum Support International- Warm-line: 346-808-8641                                                      __Outpatient Therapy and Medication Management   Providers:  [facility]: [phone] Hours: 9AM-5PM  Insurance Accepted: BRYAN Leyden, BCBS, Sherleen Hough, [place], Medicare  [facility]): [phone] Hours: 8AM-5:30PM  nsurance Accepted: All insurances EXCEPT AARP, [place], [place], and [employer] of the [place]: [phone] Hours: 8AM-8PM Insurance Accepted: Ley[place] OLIPHANT, Sherleen Hough, [place], Medicare, Delano Gasper [place] [phone] [facility]: [phone] Hours: 8:30AM-7PM Insurance Accepted: Leyden OLIPHANT, Medicaid, Medicare, Tricare, [employer] [facility]:  [phone]   Hours:9AM-5PM Insurance Accepted:  Leyden OLIPHANT, [place]  [address], [place], [facility]: [phone] Hours: 9AM-5:30PM Insurance Accepted: BRYAN Leyden, CHARON Pastures, and [place], Medicare, [facility]:  [phone] Hours: 9am-5pm Insurance Accepted: BCBS; they do not accept Medicaid/Medicare [facility]: [phone] Hours: 9am-9pm Insurance Accepted: All major insurance including Medicaid and Medicare [facility]: [phone] Hours: 9AM- 5PM Insurance Accepted: All insurances EXCEPT Medicaid and Medicare. [facility]: [phone]   ____________                                                                     Parenting Support Groups [facility] :  [address] [place], [place], [address] [phone] [facility]:  [phone] Family Support Network: (support for children in the NICU and/or with special needs),  [phone]     _____________                                                                                  Online Resources Postpartum Support International: SeekAlumni.co.za  800-944-4PPD Supporting Moms:  www.momssupportingmoms.net

## 2023-10-16 ENCOUNTER — Encounter: Payer: MEDICAID | Admitting: Family Medicine

## 2023-10-23 ENCOUNTER — Ambulatory Visit: Payer: MEDICAID | Attending: Obstetrics and Gynecology

## 2023-10-23 ENCOUNTER — Other Ambulatory Visit: Payer: MEDICAID

## 2023-10-27 ENCOUNTER — Inpatient Hospital Stay (HOSPITAL_COMMUNITY): Payer: MEDICAID | Admitting: Anesthesiology

## 2023-10-27 ENCOUNTER — Other Ambulatory Visit: Payer: Self-pay

## 2023-10-27 ENCOUNTER — Encounter (HOSPITAL_COMMUNITY): Payer: Self-pay | Admitting: Obstetrics & Gynecology

## 2023-10-27 ENCOUNTER — Inpatient Hospital Stay (HOSPITAL_COMMUNITY)
Admission: AD | Admit: 2023-10-27 | Discharge: 2023-10-30 | DRG: 807 | Disposition: A | Discharge status: Home / Self Care | Payer: MEDICAID | Attending: Obstetrics & Gynecology | Admitting: Obstetrics & Gynecology

## 2023-10-27 DIAGNOSIS — O99344 Other mental disorders complicating childbirth: Secondary | ICD-10-CM | POA: Diagnosis present

## 2023-10-27 DIAGNOSIS — Z8249 Family history of ischemic heart disease and other diseases of the circulatory system: Secondary | ICD-10-CM

## 2023-10-27 DIAGNOSIS — O34211 Maternal care for low transverse scar from previous cesarean delivery: Secondary | ICD-10-CM | POA: Diagnosis not present

## 2023-10-27 DIAGNOSIS — K219 Gastro-esophageal reflux disease without esophagitis: Secondary | ICD-10-CM | POA: Diagnosis present

## 2023-10-27 DIAGNOSIS — O34219 Maternal care for unspecified type scar from previous cesarean delivery: Secondary | ICD-10-CM | POA: Diagnosis present

## 2023-10-27 DIAGNOSIS — F17210 Nicotine dependence, cigarettes, uncomplicated: Secondary | ICD-10-CM | POA: Diagnosis present

## 2023-10-27 DIAGNOSIS — F319 Bipolar disorder, unspecified: Secondary | ICD-10-CM | POA: Diagnosis present

## 2023-10-27 DIAGNOSIS — O99824 Streptococcus B carrier state complicating childbirth: Secondary | ICD-10-CM | POA: Diagnosis present

## 2023-10-27 DIAGNOSIS — Z5941 Food insecurity: Secondary | ICD-10-CM | POA: Diagnosis not present

## 2023-10-27 DIAGNOSIS — O134 Gestational [pregnancy-induced] hypertension without significant proteinuria, complicating childbirth: Principal | ICD-10-CM | POA: Diagnosis present

## 2023-10-27 DIAGNOSIS — Z5986 Financial insecurity: Secondary | ICD-10-CM | POA: Diagnosis not present

## 2023-10-27 DIAGNOSIS — O24414 Gestational diabetes mellitus in pregnancy, insulin controlled: Secondary | ICD-10-CM

## 2023-10-27 DIAGNOSIS — F1490 Cocaine use, unspecified, uncomplicated: Secondary | ICD-10-CM | POA: Diagnosis present

## 2023-10-27 DIAGNOSIS — O24424 Gestational diabetes mellitus in childbirth, insulin controlled: Secondary | ICD-10-CM | POA: Diagnosis present

## 2023-10-27 DIAGNOSIS — Z833 Family history of diabetes mellitus: Secondary | ICD-10-CM

## 2023-10-27 DIAGNOSIS — O99820 Streptococcus B carrier state complicating pregnancy: Secondary | ICD-10-CM | POA: Diagnosis not present

## 2023-10-27 DIAGNOSIS — O99324 Drug use complicating childbirth: Secondary | ICD-10-CM | POA: Diagnosis present

## 2023-10-27 DIAGNOSIS — O09523 Supervision of elderly multigravida, third trimester: Principal | ICD-10-CM

## 2023-10-27 DIAGNOSIS — O365990 Maternal care for other known or suspected poor fetal growth, unspecified trimester, not applicable or unspecified: Secondary | ICD-10-CM

## 2023-10-27 DIAGNOSIS — O9962 Diseases of the digestive system complicating childbirth: Secondary | ICD-10-CM | POA: Diagnosis present

## 2023-10-27 DIAGNOSIS — Z5982 Transportation insecurity: Secondary | ICD-10-CM

## 2023-10-27 DIAGNOSIS — O139 Gestational [pregnancy-induced] hypertension without significant proteinuria, unspecified trimester: Secondary | ICD-10-CM | POA: Insufficient documentation

## 2023-10-27 DIAGNOSIS — O99334 Smoking (tobacco) complicating childbirth: Secondary | ICD-10-CM | POA: Diagnosis present

## 2023-10-27 DIAGNOSIS — O26893 Other specified pregnancy related conditions, third trimester: Secondary | ICD-10-CM | POA: Diagnosis present

## 2023-10-27 DIAGNOSIS — Z3A36 36 weeks gestation of pregnancy: Secondary | ICD-10-CM

## 2023-10-27 DIAGNOSIS — O4202 Full-term premature rupture of membranes, onset of labor within 24 hours of rupture: Secondary | ICD-10-CM | POA: Diagnosis not present

## 2023-10-27 LAB — CBC
HCT: 40.2 % (ref 36.0–46.0)
HCT: 40.4 % (ref 36.0–46.0)
Hemoglobin: 13.5 g/dL (ref 12.0–15.0)
Hemoglobin: 13.4 g/dL (ref 12.0–15.0)
MCH: 30.3 pg (ref 26.0–34.0)
MCH: 29.8 pg (ref 26.0–34.0)
MCHC: 33.6 g/dL (ref 30.0–36.0)
MCHC: 33.2 g/dL (ref 30.0–36.0)
MCV: 90.1 fL (ref 80.0–100.0)
MCV: 90 fL (ref 80.0–100.0)
Platelets: 189 K/uL (ref 150–400)
Platelets: 185 K/uL (ref 150–400)
RBC: 4.46 MIL/uL (ref 3.87–5.11)
RBC: 4.49 MIL/uL (ref 3.87–5.11)
RDW: 13.9 % (ref 11.5–15.5)
RDW: 13.7 % (ref 11.5–15.5)
WBC: 9.8 K/uL (ref 4.0–10.5)
WBC: 14.5 K/uL — ABNORMAL HIGH (ref 4.0–10.5)
nRBC: 0 % (ref 0.0–0.2)
nRBC: 0 % (ref 0.0–0.2)

## 2023-10-27 LAB — COMPREHENSIVE METABOLIC PANEL WITH GFR
ALT: 21 U/L (ref 0–44)
AST: 20 U/L (ref 15–41)
Albumin: 2.7 g/dL — ABNORMAL LOW (ref 3.5–5.0)
Alkaline Phosphatase: 240 U/L — ABNORMAL HIGH (ref 38–126)
Anion gap: 11 (ref 5–15)
BUN: 8 mg/dL (ref 6–20)
CO2: 19 mmol/L — ABNORMAL LOW (ref 22–32)
Calcium: 8.8 mg/dL — ABNORMAL LOW (ref 8.9–10.3)
Chloride: 106 mmol/L (ref 98–111)
Creatinine, Ser: 0.83 mg/dL (ref 0.44–1.00)
GFR, Estimated: >60 mL/min (ref >60)
Glucose, Bld: 94 mg/dL (ref 70–99)
Potassium: 4.1 mmol/L (ref 3.5–5.1)
Sodium: 136 mmol/L (ref 135–145)
Total Bilirubin: 0.8 mg/dL (ref 0.0–1.2)
Total Protein: 6 g/dL — ABNORMAL LOW (ref 6.5–8.1)

## 2023-10-27 LAB — RAPID URINE DRUG SCREEN, HOSP PERFORMED
Amphetamines: NOT DETECTED
Barbiturates: NOT DETECTED
Benzodiazepines: NOT DETECTED
Cocaine: POSITIVE — AB
Opiates: NOT DETECTED
Tetrahydrocannabinol: POSITIVE — AB

## 2023-10-27 LAB — GLUCOSE, CAPILLARY
Glucose-Capillary: 96 mg/dL (ref 70–99)
Glucose-Capillary: 110 mg/dL — ABNORMAL HIGH (ref 70–99)
Glucose-Capillary: 74 mg/dL (ref 70–99)
Glucose-Capillary: 97 mg/dL (ref 70–99)

## 2023-10-27 LAB — TYPE AND SCREEN
ABO/RH(D): O POS
Antibody Screen: NEGATIVE

## 2023-10-27 LAB — RPR: RPR Ser Ql: NONREACTIVE

## 2023-10-27 LAB — POCT FERN TEST: POCT Fern Test: POSITIVE

## 2023-10-27 MED ORDER — PRENATAL MULTIVITAMIN CH
1.0000 | ORAL_TABLET | Freq: Every day | ORAL | Status: DC
  Administered 2023-10-27 – 2023-10-30 (×4): 1 via ORAL
  Filled 2023-10-27 (×5): qty 1

## 2023-10-27 MED ORDER — LACTATED RINGERS IV SOLN: 500.0000 mL | INTRAVENOUS | Status: DC | PRN

## 2023-10-27 MED ORDER — ONDANSETRON HCL 4 MG/2ML IJ SOLN: 4.0000 mg | INTRAMUSCULAR | Status: DC | PRN

## 2023-10-27 MED ORDER — ONDANSETRON HCL 4 MG/2ML IJ SOLN
4.0000 mg | Freq: Four times a day (QID) | INTRAMUSCULAR | Status: DC | PRN
Start: 2023-10-27 — End: 2023-10-27

## 2023-10-27 MED ORDER — LIDOCAINE HCL (PF) 1 % IJ SOLN
INTRAMUSCULAR | Status: DC | PRN
  Administered 2023-10-27 (×2): 4 mL via EPIDURAL

## 2023-10-27 MED ORDER — INSULIN GLARGINE 100 UNIT/ML ~~LOC~~ SOLN
5.0000 [IU] | Freq: Every day | SUBCUTANEOUS | Status: DC
  Filled 2023-10-27: qty 0.05

## 2023-10-27 MED ORDER — FENTANYL-BUPIVACAINE-NACL 0.5-0.125-0.9 MG/250ML-% EP SOLN
12.0000 mL/h | EPIDURAL | Status: DC | PRN
  Administered 2023-10-27: 12 mL/h via EPIDURAL
  Filled 2023-10-27: qty 250

## 2023-10-27 MED ORDER — ONDANSETRON HCL 4 MG PO TABS: 4.0000 mg | ORAL_TABLET | ORAL | Status: DC | PRN

## 2023-10-27 MED ORDER — FUROSEMIDE 20 MG PO TABS
20.0000 mg | ORAL_TABLET | Freq: Every day | ORAL | Status: DC
  Administered 2023-10-27 – 2023-10-30 (×4): 20 mg via ORAL
  Filled 2023-10-27 (×5): qty 1

## 2023-10-27 MED ORDER — PHENYLEPHRINE 80 MCG/ML (10ML) SYRINGE FOR IV PUSH (FOR BLOOD PRESSURE SUPPORT): 80.0000 ug | PREFILLED_SYRINGE | INTRAVENOUS | Status: DC | PRN

## 2023-10-27 MED ORDER — TERBUTALINE SULFATE 1 MG/ML IJ SOLN: 0.2500 mg | Freq: Once | INTRAMUSCULAR | Status: DC | PRN

## 2023-10-27 MED ORDER — TETANUS-DIPHTH-ACELL PERTUSSIS 5-2.5-18.5 LF-MCG/0.5 IM SUSY: 0.5000 mL | PREFILLED_SYRINGE | Freq: Once | INTRAMUSCULAR | Status: DC

## 2023-10-27 MED ORDER — DIPHENHYDRAMINE HCL 25 MG PO CAPS: 25.0000 mg | ORAL_CAPSULE | Freq: Four times a day (QID) | ORAL | Status: DC | PRN

## 2023-10-27 MED ORDER — MAGNESIUM HYDROXIDE 400 MG/5ML PO SUSP: 30.0000 mL | Freq: Every day | ORAL | Status: DC | PRN

## 2023-10-27 MED ORDER — OXYCODONE-ACETAMINOPHEN 5-325 MG PO TABS: 2.0000 | ORAL_TABLET | ORAL | Status: DC | PRN

## 2023-10-27 MED ORDER — OXYTOCIN-SODIUM CHLORIDE 30-0.9 UT/500ML-% IV SOLN
2.5000 [IU]/h | INTRAVENOUS | Status: DC
Start: 2023-10-27 — End: 2023-10-27
  Filled 2023-10-27: qty 500

## 2023-10-27 MED ORDER — EPHEDRINE 5 MG/ML INJ: 10.0000 mg | INTRAVENOUS | Status: DC | PRN

## 2023-10-27 MED ORDER — SOD CITRATE-CITRIC ACID 500-334 MG/5ML PO SOLN: 30.0000 mL | ORAL | Status: DC | PRN

## 2023-10-27 MED ORDER — MEASLES, MUMPS & RUBELLA VAC IJ SOLR: 0.5000 mL | Freq: Once | INTRAMUSCULAR | Status: DC

## 2023-10-27 MED ORDER — BENZOCAINE-MENTHOL 20-0.5 % EX AERO
1.0000 | INHALATION_SPRAY | CUTANEOUS | Status: DC | PRN
Start: 2023-10-27 — End: 2023-10-31

## 2023-10-27 MED ORDER — TRANEXAMIC ACID-NACL 1000-0.7 MG/100ML-% IV SOLN: 1000.0000 mg | INTRAVENOUS | Status: AC

## 2023-10-27 MED ORDER — OXYTOCIN BOLUS FROM INFUSION: 333.0000 mL | Freq: Once | INTRAVENOUS | Status: DC

## 2023-10-27 MED ORDER — OXYTOCIN-SODIUM CHLORIDE 30-0.9 UT/500ML-% IV SOLN
1.0000 m[IU]/min | INTRAVENOUS | Status: DC
  Administered 2023-10-27: 2 m[IU]/min via INTRAVENOUS

## 2023-10-27 MED ORDER — FENTANYL CITRATE (PF) 100 MCG/2ML IJ SOLN
50.0000 ug | INTRAMUSCULAR | Status: DC | PRN
  Administered 2023-10-27: 100 ug via INTRAVENOUS
  Filled 2023-10-27: qty 2

## 2023-10-27 MED ORDER — DIPHENHYDRAMINE HCL 50 MG/ML IJ SOLN: 12.5000 mg | INTRAMUSCULAR | Status: DC | PRN

## 2023-10-27 MED ORDER — INSULIN GLARGINE-YFGN 100 UNIT/ML ~~LOC~~ SOLN: 5.0000 [IU] | Freq: Every day | SUBCUTANEOUS | Status: DC

## 2023-10-27 MED ORDER — PENICILLIN G POT IN DEXTROSE 60000 UNIT/ML IV SOLN
3.0000 10*6.[IU] | INTRAVENOUS | Status: DC
  Administered 2023-10-27 (×2): 3 10*6.[IU] via INTRAVENOUS
  Filled 2023-10-27 (×2): qty 50

## 2023-10-27 MED ORDER — DIBUCAINE (PERIANAL) 1 % EX OINT
1.0000 | TOPICAL_OINTMENT | CUTANEOUS | Status: DC | PRN
Start: 2023-10-27 — End: 2023-10-31

## 2023-10-27 MED ORDER — POTASSIUM CHLORIDE CRYS ER 20 MEQ PO TBCR
20.0000 meq | EXTENDED_RELEASE_TABLET | Freq: Every day | ORAL | Status: DC
  Administered 2023-10-27 – 2023-10-30 (×4): 20 meq via ORAL
  Filled 2023-10-27 (×5): qty 1

## 2023-10-27 MED ORDER — SODIUM CHLORIDE 0.9 % IV SOLN
5.0000 10*6.[IU] | Freq: Once | INTRAVENOUS | Status: AC
  Administered 2023-10-27: 5 10*6.[IU] via INTRAVENOUS
  Filled 2023-10-27: qty 5

## 2023-10-27 MED ORDER — LACTATED RINGERS IV SOLN: 500.0000 mL | Freq: Once | INTRAVENOUS | Status: DC

## 2023-10-27 MED ORDER — SENNOSIDES-DOCUSATE SODIUM 8.6-50 MG PO TABS
2.0000 | ORAL_TABLET | Freq: Every day | ORAL | Status: DC
  Administered 2023-10-28 – 2023-10-30 (×3): 2 via ORAL
  Filled 2023-10-27 (×4): qty 2

## 2023-10-27 MED ORDER — OXYCODONE-ACETAMINOPHEN 5-325 MG PO TABS: 1.0000 | ORAL_TABLET | ORAL | Status: DC | PRN

## 2023-10-27 MED ORDER — COCONUT OIL OIL: 1.0000 | TOPICAL_OIL | Status: DC | PRN

## 2023-10-27 MED ORDER — TRANEXAMIC ACID-NACL 1000-0.7 MG/100ML-% IV SOLN
INTRAVENOUS | Status: AC
  Administered 2023-10-27: 1000 mg via INTRAVENOUS
  Filled 2023-10-27: qty 100

## 2023-10-27 MED ORDER — WITCH HAZEL-GLYCERIN EX PADS: 1.0000 | MEDICATED_PAD | CUTANEOUS | Status: DC | PRN

## 2023-10-27 MED ORDER — ACETAMINOPHEN 325 MG PO TABS
650.0000 mg | ORAL_TABLET | ORAL | Status: DC | PRN
  Administered 2023-10-28: 650 mg via ORAL
  Filled 2023-10-27: qty 2

## 2023-10-27 MED ORDER — FLEET ENEMA RE ENEM: 1.0000 | ENEMA | RECTAL | Status: DC | PRN

## 2023-10-27 MED ORDER — ACETAMINOPHEN 325 MG PO TABS: 650.0000 mg | ORAL_TABLET | ORAL | Status: DC | PRN

## 2023-10-27 MED ORDER — LACTATED RINGERS IV SOLN: INTRAVENOUS | Status: DC

## 2023-10-27 MED ORDER — LIDOCAINE HCL (PF) 1 % IJ SOLN
30.0000 mL | INTRAMUSCULAR | Status: DC | PRN
Start: 2023-10-27 — End: 2023-10-27

## 2023-10-27 MED ORDER — SIMETHICONE 80 MG PO CHEW: 80.0000 mg | CHEWABLE_TABLET | ORAL | Status: DC | PRN

## 2023-10-27 MED ORDER — IBUPROFEN 600 MG PO TABS
600.0000 mg | ORAL_TABLET | Freq: Four times a day (QID) | ORAL | Status: DC
  Administered 2023-10-27 – 2023-10-30 (×11): 600 mg via ORAL
  Filled 2023-10-27 (×11): qty 1

## 2023-10-27 NOTE — Progress Notes (Signed)
 Patient's bedtime CBG 97. Called Dr. Magali and instructed to hold lantus  for tonight.

## 2023-10-27 NOTE — Anesthesia Preprocedure Evaluation (Addendum)
 Anesthesia Evaluation  Patient identified by MRN, date of birth, ID band Patient awake    Reviewed: Allergy & Precautions, Patient's Chart, lab work & pertinent test results  Airway Mallampati: II  TM Distance: >3 FB     Dental no notable dental hx. (+) Teeth Intact, Dental Advisory Given   Pulmonary Current Smoker and Patient abstained from smoking.   Pulmonary exam normal        Cardiovascular negative cardio ROS Normal cardiovascular exam Rhythm:Regular     Neuro/Psych  PSYCHIATRIC DISORDERS Anxiety Depression    negative neurological ROS     GI/Hepatic ,GERD  ,,(+)     substance abuse  cocaine useLast cocaine use yesterday   Endo/Other  diabetes, Well Controlled, Gestational, Insulin  Dependent    Renal/GU negative Renal ROS  negative genitourinary   Musculoskeletal   Abdominal   Peds  Hematology negative hematology ROS (+)   Anesthesia Other Findings   Reproductive/Obstetrics (+) Pregnancy Hx/o Previous C/Section AMA                              Anesthesia Physical Anesthesia Plan  ASA: 2  Anesthesia Plan: Epidural   Post-op Pain Management: Minimal or no pain anticipated   Induction:   PONV Risk Score and Plan: Treatment may vary due to age or medical condition  Airway Management Planned: Natural Airway  Additional Equipment: Fetal Monitoring and None  Intra-op Plan:   Post-operative Plan:   Informed Consent: I have reviewed the patients History and Physical, chart, labs and discussed the procedure including the risks, benefits and alternatives for the proposed anesthesia with the patient or authorized representative who has indicated his/her understanding and acceptance.       Plan Discussed with: Anesthesiologist  Anesthesia Plan Comments:          Anesthesia Quick Evaluation

## 2023-10-27 NOTE — Discharge Summary (Signed)
 Postpartum Discharge Summary  Date of Service updated***     Patient Name: Valerie Reyes DOB: [birth year] MRN: [id number]  Date of admission: 10/27/2023 Delivery date:10/27/2023 Delivering provider: DANNY GERALDS Date of discharge: 10/27/2023  Admitting diagnosis: Normal labor [O80, Z37.9] Intrauterine pregnancy: [redacted]w[redacted]d     Secondary diagnosis:  Principal Problem:   Normal labor  Additional problems: A2GDM,  cocaine during pregnancy, new onset gHTN, GBS +    Discharge diagnosis: {DX.:23714}                                              Post partum procedures:{Postpartum procedures:23558} Augmentation: Pitocin  Complications: none  Hospital course: Onset of Labor With Vaginal Delivery      38 y.o. yo G2P1001 at [redacted]w[redacted]d was admitted in Latent Labor on 10/27/2023. Labor course was complicated bynone  Membrane Rupture Time/Date: 12:00 AM,10/27/2023  Delivery Method:VBAC, Spontaneous Operative Delivery:N/A Episiotomy: None Lacerations:  Periurethral;Labial Patient had a postpartum course complicated by ***.  She is ambulating, tolerating a regular diet, passing flatus, and urinating well. Patient is discharged home in stable condition on 10/27/23.  Newborn Data: Birth date:10/27/2023 Birth time:3:44 PM Gender:Female Living status:Living Apgars:6 ,6  Weight:2130 g  Magnesium  Sulfate received: {Mag received:30440022} BMZ received: No Rhophylac:No MMR:No T-DaP:given 2020 Flu: No RSV Vaccine received: No Transfusion:{Transfusion received:30440034}  Immunizations received: Immunization History  Administered Date(s) Administered   Influenza Split 04/23/2012   Tdap 05/21/2012, 11/30/2018    Physical exam  Vitals:   10/27/23 1432 10/27/23 1501 10/27/23 1602 10/27/23 1616  BP: 128/81 120/82 138/75 136/81  Pulse: 85 (!) 110 (!) 105 86  Resp:      Temp:      TempSrc:      SpO2:       General: {Exam; general:21111117} Lochia: {Desc;  appropriate/inappropriate:30686::appropriate} Uterine Fundus: {Desc; firm/soft:30687} Incision: {Exam; incision:21111123} DVT Evaluation: {Exam; dvt:2111122} Labs: Lab Results  Component Value Date   WBC 14.5 (H) 10/27/2023   HGB 13.4 10/27/2023   HCT 40.4 10/27/2023   MCV 90.0 10/27/2023   PLT 185 10/27/2023      Latest Ref Rng & Units 10/27/2023   11:41 AM  CMP  Glucose 70 - 99 mg/dL 94   BUN 6 - 20 mg/dL 8   Creatinine 9.55 - 8.99 mg/dL 9.16   Sodium 864 - 854 mmol/L 136   Potassium 3.5 - 5.1 mmol/L 4.1   Chloride 98 - 111 mmol/L 106   CO2 22 - 32 mmol/L 19   Calcium 8.9 - 10.3 mg/dL 8.8   Total Protein 6.5 - 8.1 g/dL 6.0   Total Bilirubin 0.0 - 1.2 mg/dL 0.8   Alkaline Phos 38 - 126 U/L 240   AST 15 - 41 U/L 20   ALT 0 - 44 U/L 21    Edinburgh Score:     No data to display         No data recorded  After visit meds:  Allergies as of 10/27/2023   No Known Allergies   Med Rec must be completed prior to using this [facility]***        Discharge home in stable condition Infant Feeding: {Baby feeding:23562} Infant Disposition:{CHL IP OB HOME WITH FNUYZM:76418} Discharge instruction: per After Visit Summary and Postpartum booklet. Activity: Advance as tolerated. Pelvic rest for 6 weeks.  Diet: {OB ipzu:78888878} Future Appointments: Future Appointments  Date  Time Provider Department Center  11/01/2023 10:15 AM [facility] PROVIDER 1 [facility]  11/01/2023 10:30 AM [facility] US6 [facility]  11/23/2023 10:40 AM Tobb, Dub, DO [facility]  12/31/2023  9:00 AM Lorren Greig PARAS, NP [facility] [address]   Follow up Visit:   Please schedule this patient for a In person postpartum visit in 4 weeks with the following provider: Any provider. Additional Postpartum F/U:Postpartum Depression checkup and BP check 1 week  High risk pregnancy complicated by: GDM, HTN, and cocaine use Delivery mode:  VBAC, Spontaneous Anticipated Birth Control:  Nexplanon   Message sent  8/30  10/27/2023 Buel Avers, MD
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
## 2023-10-27 NOTE — Anesthesia Procedure Notes (Signed)
 Epidural Patient location during procedure: OB Start time: 10/27/2023 3:52 AM End time: 10/27/2023 4:01 AM  Staffing Anesthesiologist: Jerrye Sharper, MD Performed: anesthesiologist   Preanesthetic Checklist Completed: patient identified, IV checked, site marked, risks and benefits discussed, surgical consent, monitors and equipment checked, pre-op evaluation and timeout performed  Epidural Patient position: sitting Prep: DuraPrep and site prepped and draped Patient monitoring: continuous pulse ox and blood pressure Approach: midline Location: L3-L4 Injection technique: LOR air  Needle:  Needle type: Tuohy  Needle gauge: 17 G Needle length: 9 cm and 9 Needle insertion depth: 5 cm Catheter type: closed end flexible Catheter size: 19 Gauge Catheter at skin depth: 10 cm Test dose: negative and Other  Assessment Events: blood not aspirated, no cerebrospinal fluid, injection not painful, no injection resistance, no paresthesia and negative IV test  Additional Notes Patient identified. Risks and benefits discussed including failed block, incomplete  Pain control, post dural puncture headache, nerve damage, paralysis, blood pressure Changes, nausea, vomiting, reactions to medications-both toxic and allergic and post Partum back pain. All questions were answered. Patient expressed understanding and wished to proceed. Sterile technique was used throughout procedure. Epidural site was Dressed with sterile barrier dressing. No paresthesias, signs of intravascular injection Or signs of intrathecal spread were encountered.  Patient was more comfortable after the epidural was dosed. Please see RN's note for documentation of vital signs and FHR which are stable. Reason for block:procedure for pain

## 2023-10-27 NOTE — Progress Notes (Signed)
 Order for titratable Pitocin  for augmentation acknowledged but administration not initiated due to inability to provide 1:1 nursing care per [facility] standards for a TOLAC on Pitocin . Dr. Jayne and charge nurse notified.

## 2023-10-27 NOTE — MAU Note (Signed)
 Valerie Reyes is a 38 y.o. at [redacted]w[redacted]d here in MAU reporting: SROM of clear fluid at approximately midnight with onset of regular contractions within 30 minutes.  LMP: 02/13/2023 Onset of complaint: 0000 on 10/27/2023 Pain score: 10/10 Vitals:   10/27/23 0119  BP: 130/82  Pulse: 83  Resp: 20  Temp: (!) 97.5 F (36.4 C)     FHT: 123bpm Lab orders placed from triage: FERN/Rule Out Rupture

## 2023-10-27 NOTE — Progress Notes (Signed)
 Labor Progress Note Valerie Reyes is a 38 y.o. G2P1001 at [redacted]w[redacted]d  by LMP c/w 13 wk US  presenting for SROM. Hx significant for cocaine use this pregnancy, A2GDM, FGR 3.2%, DV/SA from FOB. S: Pt is feeling nervous for this delivery having 11 years apart from her last delivery  O:  BP (!) 141/83   Pulse 83   Temp 98.2 F (36.8 C) (Oral)   Resp 18   LMP 02/13/2023   SpO2 100%  EFM: 135/mod var/accels/variable deccels  CVE: Dilation: 8 Effacement (%): 80 Cervical Position: Anterior Station: -1 Presentation: Vertex Exam by:: Wyvonna Lemmings RN   A&P: 38 y.o. G2P1001 [redacted]w[redacted]d presenting for SROM. #Labor: Progressing well in active phase of labor. Started on pitocin . #Pain: epidural #FWB: Cat 2 strip, overall reassuring given clinical context #GBS positive s/p 2 doses penicillin  #gHTN: new onset this admission. Obtain PEC labs  Buel Avers, MD 11:45 AM

## 2023-10-28 LAB — CBC
HCT: 33.1 % — ABNORMAL LOW (ref 36.0–46.0)
Hemoglobin: 11 g/dL — ABNORMAL LOW (ref 12.0–15.0)
MCH: 30.4 pg (ref 26.0–34.0)
MCHC: 33.2 g/dL (ref 30.0–36.0)
MCV: 91.4 fL (ref 80.0–100.0)
Platelets: 147 K/uL — ABNORMAL LOW (ref 150–400)
RBC: 3.62 MIL/uL — ABNORMAL LOW (ref 3.87–5.11)
RDW: 14.1 % (ref 11.5–15.5)
WBC: 14.2 K/uL — ABNORMAL HIGH (ref 4.0–10.5)
nRBC: 0 % (ref 0.0–0.2)

## 2023-10-28 LAB — GLUCOSE, CAPILLARY: Glucose-Capillary: 109 mg/dL — ABNORMAL HIGH (ref 70–99)

## 2023-10-28 MED ORDER — ETONOGESTREL 68 MG ~~LOC~~ IMPL: 68.0000 mg | DRUG_IMPLANT | Freq: Once | SUBCUTANEOUS | Status: DC

## 2023-10-28 MED ORDER — OXYCODONE HCL 5 MG PO TABS: 5.0000 mg | ORAL_TABLET | ORAL | Status: DC | PRN

## 2023-10-28 MED ORDER — LIDOCAINE HCL 1 % IJ SOLN: 0.0000 mL | Freq: Once | INTRAMUSCULAR | Status: DC | PRN

## 2023-10-28 NOTE — Anesthesia Postprocedure Evaluation (Signed)
 Anesthesia Post Note  Patient: Valerie Reyes  Procedure(s) Performed: AN AD HOC LABOR EPIDURAL     Patient location during evaluation: Mother Baby Anesthesia Type: Epidural Level of consciousness: awake and alert Pain management: pain level controlled Vital Signs Assessment: post-procedure vital signs reviewed and stable Respiratory status: spontaneous breathing, nonlabored ventilation and respiratory function stable Cardiovascular status: stable Postop Assessment: no headache, no backache, epidural receding, no apparent nausea or vomiting, patient able to bend at knees, able to ambulate and adequate PO intake Anesthetic complications: no   No notable events documented.  Last Vitals:  Vitals:   10/28/23 0343 10/28/23 0810  BP: 113/63 121/80  Pulse: 92 82  Resp: 18 16  Temp: 36.8 C 37 C  SpO2: 99%     Last Pain:  Vitals:   10/28/23 1212  TempSrc:   PainSc: 6    Pain Goal:                   [employer]

## 2023-10-28 NOTE — Progress Notes (Signed)
 Circumcision Consent   Discussed with mom at bedside about circumcision.    Circumcision is a surgery that removes the skin that covers the tip of the penis, called the foreskin. Circumcision is usually done when a boy is between 64 and 75 days old, sometimes up to 59-30 weeks old.   The most common reasons boys are circumcised include for cultural/religious beliefs or for parental preference (potentially easier to clean, so baby looks like daddy, etc).   There may be some medical benefits for circumcision:    Circumcised boys seem to have slightly lower rates of: ? Urinary tract infections (per the American Academy of Pediatrics an uncircumcised boy has a 1/100 chance of developing a UTI in the first year of life, a circumcised boy at a 02/998 chance of developing a UTI in the first year of life- a 10% reduction) ? Penis cancer (typically rare- an uncircumcised female has a 1 in 100,000 chance of developing cancer of the penis) ? Sexually transmitted infection (in endemic areas, including HIV, HPV and Herpes- circumcision does NOT protect against gonorrhea, chlamydia, trachomatis, or syphilis) ? Phimosis: a condition where that makes retraction of the foreskin over the glans impossible (0.4 per 1000 boys per year or 0.6% of boys are affected by their 15th birthday)   Boys and men who are not circumcised can reduce these extra risks by: ? Cleaning their penis well ? Using condoms during sex   What are the risks of circumcision?   As with any surgical procedure, there are risks and complications. In circumcision, complications are rare and usually minor, the most common being: ? Bleeding- risk is reduced by holding each clamp for 30 seconds prior to a cut being made, and by holding pressure after the procedure is done ? Infection- the penis is cleaned prior to the procedure, and the procedure is done under sterile technique ? Damage to the urethra or amputation of the penis   How is  circumcision done in baby boys?   The baby will be placed on a special table and the legs restrained for their safety. Numbing medication is injected into the penis, and the skin is cleansed with betadine to decrease the risk of infection.    What to expect:   The penis will look red and raw for 5-7 days as it heals. We expect scabbing around where the cut was made, as well as clear-pink fluid and some swelling of the penis right after the procedure. If your baby's circumcision starts to bleed or develops pus, please contact your pediatrician immediately.   All questions were answered and mother consented.     Buel Avers, MD [facility] PGY2 10/28/2023, 11:57 AM

## 2023-10-28 NOTE — Progress Notes (Signed)
 Pt reports response of 2 for the last question on the Edinburgh Postnatal Depression Scale is accurate. Denies thoughts of harming herself now or since admitted to the hospital but has had those thoughts in the last 7 days. Pt reports a lot of stress at home.

## 2023-10-28 NOTE — Progress Notes (Signed)
 AM CBG obtained 109. Per pt, she ate some chicken after taking motrin  at 0213. Dr. Magali in room right afterward and informed that CBG not a true fasting this AM. Verbal order to discontinue lantus  and bedtime CBG. Verbal order to obtain fasting CBG in the AM 10/29/2023.
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
## 2023-10-28 NOTE — Progress Notes (Signed)
 POSTPARTUM PROGRESS NOTE  Post Partum Day 1  Subjective:  Valerie Reyes is a 38 y.o. [id number] s/p VBAC at [redacted]w[redacted]d.  She reports she is doing well. No acute events overnight. She denies any problems with ambulating, voiding or po intake. Denies nausea or vomiting.  Pain is poorly controlled.  Lochia is minimal.  Objective: Blood pressure 113/63, pulse 92, temperature 98.3 F (36.8 C), resp. rate 18, last menstrual period 02/13/2023, SpO2 99%, unknown if currently breastfeeding.  Physical Exam:  General: alert, cooperative and no distress Chest: no respiratory distress Heart:regular rate, distal pulses intact Abdomen: soft, nontender,  Uterine Fundus: firm, appropriately tender DVT Evaluation: No calf swelling or tenderness Extremities: No edema Skin: warm, dry  Recent Labs    10/27/23 1141 10/28/23 0522  HGB 13.4 11.0*  HCT 40.4 33.1*    Assessment/Plan: Valerie Reyes is a 38 y.o. [id number] s/p VBAC at [redacted]w[redacted]d   PPD#1  - Patient in a lot of pain when I went into the room. Will add oxycodone  5mg  q4h for breakthrough. Lochia minimal, fundus firm.   Routine postpartum care  Contraception: nexplanon  Feeding: breast and bottle Dispo: Plan for discharge tomorrow.   LOS: 1 day   Barkley Angles, MD OB Fellow, Faculty Practice [facility], [facility] [employer]

## 2023-10-28 NOTE — Plan of Care (Signed)
  Problem: Health Behavior/Discharge Planning: Goal: Ability to manage health-related needs will improve Outcome: Progressing   Problem: Clinical Measurements: Goal: Ability to maintain clinical measurements within normal limits will improve Outcome: Progressing Goal: Will remain free from infection Outcome: Progressing Goal: Diagnostic test results will improve Outcome: Progressing Goal: Respiratory complications will improve Outcome: Progressing Goal: Cardiovascular complication will be avoided Outcome: Progressing   Problem: Coping: Goal: Level of anxiety will decrease Outcome: Progressing   Problem: Elimination: Goal: Will not experience complications related to bowel motility Outcome: Progressing   Problem: Pain Managment: Goal: General experience of comfort will improve and/or be controlled Outcome: Progressing   Problem: Safety: Goal: Ability to remain free from injury will improve Outcome: Progressing   Problem: Skin Integrity: Goal: Risk for impaired skin integrity will decrease Outcome: Progressing   Problem: Education: Goal: Knowledge of Childbirth will improve Outcome: Progressing Goal: Ability to make informed decisions regarding treatment and plan of care will improve Outcome: Progressing Goal: Ability to state and carry out methods to decrease the pain will improve Outcome: Progressing Goal: Individualized Educational Video(s) Outcome: Progressing   Problem: Coping: Goal: Ability to verbalize concerns and feelings about labor and delivery will improve Outcome: Progressing   Problem: Life Cycle: Goal: Ability to make normal progression through stages of labor will improve Outcome: Progressing Goal: Ability to effectively push during vaginal delivery will improve Outcome: Progressing   Problem: Role Relationship: Goal: Will demonstrate positive interactions with the child Outcome: Progressing   Problem: Safety: Goal: Risk of complications  during labor and delivery will decrease Outcome: Progressing   Problem: Pain Management: Goal: Relief or control of pain from uterine contractions will improve Outcome: Progressing   Problem: Education: Goal: Knowledge of condition will improve Outcome: Progressing Goal: Individualized Educational Video(s) Outcome: Progressing Goal: Individualized Newborn Educational Video(s) Outcome: Progressing   Problem: Coping: Goal: Ability to identify and utilize available resources and services will improve Outcome: Progressing   Problem: Life Cycle: Goal: Chance of risk for complications during the postpartum period will decrease Outcome: Progressing   Problem: Role Relationship: Goal: Ability to demonstrate positive interaction with newborn will improve Outcome: Progressing   Problem: Skin Integrity: Goal: Demonstration of wound healing without infection will improve Outcome: Progressing   Problem: Education: Goal: Knowledge of condition will improve Outcome: Progressing Goal: Individualized Educational Video(s) Outcome: Progressing Goal: Individualized Newborn Educational Video(s) Outcome: Progressing   Problem: Activity: Goal: Will verbalize the importance of balancing activity with adequate rest periods Outcome: Progressing Goal: Ability to tolerate increased activity will improve Outcome: Progressing   Problem: Coping: Goal: Ability to identify and utilize available resources and services will improve Outcome: Progressing   Problem: Life Cycle: Goal: Chance of risk for complications during the postpartum period will decrease Outcome: Progressing   Problem: Role Relationship: Goal: Ability to demonstrate positive interaction with newborn will improve Outcome: Progressing   Problem: Skin Integrity: Goal: Demonstration of wound healing without infection will improve Outcome: Progressing

## 2023-10-29 LAB — GLUCOSE, CAPILLARY: Glucose-Capillary: 83 mg/dL (ref 70–99)

## 2023-10-29 MED ORDER — PHEXXI 1.8-1-0.4 % VA GEL
VAGINAL | 12 refills | Status: AC
  Filled 2023-10-29: qty 5, fill #0

## 2023-10-29 MED ORDER — ARIPIPRAZOLE 5 MG PO TABS
5.0000 mg | ORAL_TABLET | Freq: Every day | ORAL | Status: DC
  Administered 2023-10-29 – 2023-10-30 (×2): 5 mg via ORAL
  Filled 2023-10-29 (×2): qty 1

## 2023-10-29 NOTE — Progress Notes (Signed)
 Post Partum Day 2 Subjective: no complaints, up ad lib, voiding and tolerating PO, small lochia, plans to bottle feed, phexxi /condoms  Objective: Blood pressure 124/77, pulse 73, temperature 98.4 F (36.9 C), temperature source Oral, resp. rate 19, height 5' 1 (1.549 m), weight 73 kg, last menstrual period 02/13/2023, SpO2 100%, unknown if currently breastfeeding.  Physical Exam:  General: alert, cooperative and no distress Lochia:normal flow Chest: CTAB Heart: RRR no m/r/g Abdomen: +BS, soft, nontender,  Uterine Fundus: firm DVT Evaluation: No evidence of DVT seen on physical exam. Extremities: no edema  Recent Labs    10/27/23 1141 10/28/23 0522  HGB 13.4 11.0*  HCT 40.4 33.1*    Assessment/Plan: Plan for discharge tomorrow and Contraception :  discussed how Nexplanon  is very similar to depo and both can increase depression.  Not really excited about an IUD, doesn't have a partner currently.  Plan phexxi /condoms, will consider IUD if she wants to have unprotected intercourse.    LOS: 2 days   Cathlean Ely 10/29/2023, 8:18 PM
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
## 2023-10-29 NOTE — Social Work (Signed)
 CSW escorted [facility] CPS social workers Arland Hancock [phone]) and Bennie Ee [phone]) to MOB's room to complete an assessment and will take CPS social workers to NICU [phone]) to visit the infant.   Nat Quiet, MSW, LCSW Clinical Social Worker  [phone] 10/29/2023  1:00 PM

## 2023-10-30 ENCOUNTER — Telehealth (HOSPITAL_COMMUNITY): Payer: Self-pay | Admitting: *Deleted

## 2023-10-30 ENCOUNTER — Other Ambulatory Visit (HOSPITAL_COMMUNITY): Payer: Self-pay

## 2023-10-30 MED ORDER — POTASSIUM CHLORIDE CRYS ER 20 MEQ PO TBCR
20.0000 meq | EXTENDED_RELEASE_TABLET | Freq: Every day | ORAL | 0 refills | Status: AC
  Filled 2023-10-30: qty 4, 4d supply, fill #0

## 2023-10-30 MED ORDER — FUROSEMIDE 20 MG PO TABS
20.0000 mg | ORAL_TABLET | Freq: Every day | ORAL | 0 refills | Status: AC
  Filled 2023-10-30: qty 4, 4d supply, fill #0

## 2023-10-30 MED ORDER — SENNOSIDES-DOCUSATE SODIUM 8.6-50 MG PO TABS
2.0000 | ORAL_TABLET | Freq: Every day | ORAL | 0 refills | Status: AC
  Filled 2023-10-30: qty 60, 30d supply, fill #0

## 2023-10-30 MED ORDER — ARIPIPRAZOLE 5 MG PO TABS
5.0000 mg | ORAL_TABLET | Freq: Every day | ORAL | 0 refills | Status: AC
  Filled 2023-10-30: qty 30, 30d supply, fill #0

## 2023-10-30 MED ORDER — BENZOCAINE-MENTHOL 20-0.5 % EX AERO
1.0000 | INHALATION_SPRAY | CUTANEOUS | 3 refills | Status: AC | PRN
  Filled 2023-10-30: qty 78, fill #0

## 2023-10-30 MED ORDER — NIFEDIPINE ER 30 MG PO TB24
30.0000 mg | ORAL_TABLET | Freq: Every day | ORAL | 0 refills | Status: AC
  Filled 2023-10-30: qty 30, 30d supply, fill #0

## 2023-10-30 MED ORDER — IBUPROFEN 600 MG PO TABS
600.0000 mg | ORAL_TABLET | Freq: Four times a day (QID) | ORAL | 0 refills | Status: AC
Start: 2023-10-30 — End: ?
  Filled 2023-10-30: qty 30, 8d supply, fill #0

## 2023-10-30 NOTE — Social Work (Signed)
 CSW received consult for Outpatient Psychiatry follow-up. CSW met with MOB to offer support.  CSW met with MOB at bedside and she was receptive to the visit. CSW provided instruction for Outpatient Psychiatry Services at [facility]. CSW informed MOB it is a Walk-In appointment and she will need to arrive by 7:00 am to been by the provider. MOB made aware that the instructions will be on her AVS. MOB verbalized understanding and had no additional questions. MOB thanked CSW for the support.   Nat Quiet, MSW, LCSW Clinical Social Worker  [phone] 10/30/2023  2:23 PM

## 2023-10-30 NOTE — Telephone Encounter (Signed)
 EPDS score in the hospital = 22, answer to question ten = 2-Sometimes. Patient seen by CSW and by psychiatrist in the hospital. She had her first dose of Abilify  on 10/29/2023 and per psychiatry note today, patient tolerated the medication and will continue it at home. Patient has scheduled blood pressure check in the office on 11/06/2023 at 15:20 and [facility] appointment on 11/08/2023 at 09:45. Spoke to Dr. Cresenzo about above information.  Mliss Sieve, RN 10/30/2023 12:13
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
## 2023-10-30 NOTE — Patient Instructions (Signed)
 If interested in an outpatient lactation consult in office or virtually please reach out to us  at [facility] (First Floor) [address], [place] Please call [phone] and press [phone] for lactation.   -- Lactation support groups:  [facility], Tuesdays 10:00 am -12:00 pm at [address] on the second floor in the conference room, lactating parents and lap babies welcome.  [email or web address]  [email or web address]    Valerie Reyes, [facility]

## 2023-11-01 ENCOUNTER — Other Ambulatory Visit: Payer: MEDICAID

## 2023-11-06 ENCOUNTER — Ambulatory Visit: Payer: MEDICAID

## 2023-11-06 ENCOUNTER — Telehealth: Payer: Self-pay

## 2023-11-06 NOTE — Telephone Encounter (Signed)
 Valerie Reyes missed her BP check appointment 11/06/23. I spoke with her today and rescheduled the appointment for 11/12/23.   Devon, RN 11/06/23

## 2023-11-07 ENCOUNTER — Other Ambulatory Visit (HOSPITAL_COMMUNITY): Payer: Self-pay

## 2023-11-07 ENCOUNTER — Telehealth (HOSPITAL_COMMUNITY): Payer: Self-pay

## 2023-11-07 NOTE — Telephone Encounter (Signed)
 11/07/2023 1015  Name: Noam Franzen MRN: [id number] DOB: 1985/07/10  Reason for Call:  Transition of Care Hospital Discharge Call  Contact Status: Patient Contact Status: Complete  Language assistant needed:          Follow-Up Questions: Do You Have Any Concerns About Your Health As You Heal From Delivery?: No (Patient states that she is ok. Patient declines questions or concerns about her health or healing.) Do You Have Any Concerns About Your Infants Health?:  (Baby D/C'd to foster care.)  Edinburgh Postnatal Depression Scale:  In the Past 7 Days: I have been able to laugh and see the funny side of things.: As much as I always could I have looked forward with enjoyment to things.: As much as I ever did I have blamed myself unnecessarily when things went wrong.: No, never I have been anxious or worried for no good reason.: No, not at all I have felt scared or panicky for no good reason.: No, not at all Things have been getting on top of me.: No, I have been coping as well as ever I have been so unhappy that I have had difficulty sleeping.: Not at all I have felt sad or miserable.: No, not at all I have been so unhappy that I have been crying.: No, never The thought of harming myself has occurred to me.: Never Edinburgh Postnatal Depression Scale Total: 0  PHQ2-9 Depression Scale:     Discharge Follow-up: Edinburgh score requires follow up?: No  Post-discharge interventions: NA  Signature  Rosaline Deretha PEAK

## 2023-11-08 ENCOUNTER — Ambulatory Visit: Payer: MEDICAID | Admitting: Clinical

## 2023-11-08 ENCOUNTER — Telehealth: Payer: Self-pay

## 2023-11-08 DIAGNOSIS — F4310 Post-traumatic stress disorder, unspecified: Secondary | ICD-10-CM

## 2023-11-08 DIAGNOSIS — F909 Attention-deficit hyperactivity disorder, unspecified type: Secondary | ICD-10-CM

## 2023-11-08 DIAGNOSIS — Z658 Other specified problems related to psychosocial circumstances: Secondary | ICD-10-CM

## 2023-11-08 DIAGNOSIS — F319 Bipolar disorder, unspecified: Secondary | ICD-10-CM

## 2023-11-08 NOTE — BH Specialist Note (Addendum)
 Brief call from pt after missing original appointment this morning; pt agrees to reschedule appointment for virtual on Tuesday, 11/13/23 at 10:15am.   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   Pt says she had an easy labor, pleased her son is gaining well after being born earlier than expected; her mom is her greatest support; now taking Abilify , feels it is helping manage her symptoms; her goal is to get her son back home with her; first step is attending court date tomorrow.

## 2023-11-08 NOTE — Telephone Encounter (Signed)
 The patient left a voicemail for a release of information request. The patient is requesting that Warren sends the information about her therapy to the judge and social services.   Devon, RN 11/08/23

## 2023-11-09 NOTE — BH Specialist Note (Signed)
 Pt did not arrive to video visit and did not answer the phone; Left HIPPA-compliant message to call back Warren from [employer] for [employer] at [facility] at  [phone] [facility] office).  ?; left MyChart message for patient.  ? ?

## 2023-11-12 ENCOUNTER — Ambulatory Visit: Payer: MEDICAID

## 2023-11-12 ENCOUNTER — Telehealth: Payer: Self-pay | Admitting: Family Medicine

## 2023-11-12 NOTE — Telephone Encounter (Signed)
 Called patient to let her know that she missed her nurse visit with us  this afternoon. She did not answer the call so I left her a message with our call back number in case she would like to reschedule.

## 2023-11-13 ENCOUNTER — Ambulatory Visit: Payer: MEDICAID | Admitting: Clinical

## 2023-11-13 DIAGNOSIS — Z91199 Patient's noncompliance with other medical treatment and regimen due to unspecified reason: Secondary | ICD-10-CM

## 2023-11-21 ENCOUNTER — Ambulatory Visit (HOSPITAL_COMMUNITY): Payer: MEDICAID | Admitting: Physician Assistant

## 2023-11-21 ENCOUNTER — Encounter: Payer: Self-pay | Admitting: *Deleted

## 2023-11-23 ENCOUNTER — Ambulatory Visit: Payer: MEDICAID | Attending: Cardiology | Admitting: Cardiology

## 2023-11-23 NOTE — Progress Notes (Unsigned)
 3

## 2023-11-27 ENCOUNTER — Ambulatory Visit: Payer: MEDICAID | Admitting: Advanced Practice Midwife

## 2023-12-11 ENCOUNTER — Ambulatory Visit: Payer: MEDICAID | Admitting: Family Medicine

## 2023-12-21 ENCOUNTER — Ambulatory Visit: Payer: MEDICAID | Admitting: Cardiology

## 2023-12-31 ENCOUNTER — Encounter: Payer: MEDICAID | Admitting: Family

## 2023-12-31 NOTE — Telephone Encounter (Signed)
 Appt rescheduled to next available

## 2023-12-31 NOTE — Progress Notes (Signed)
 Erroneous encounter-disregard

## 2024-01-02 ENCOUNTER — Telehealth (HOSPITAL_COMMUNITY): Payer: Self-pay | Admitting: Licensed Clinical Social Worker

## 2024-01-02 ENCOUNTER — Ambulatory Visit (INDEPENDENT_AMBULATORY_CARE_PROVIDER_SITE_OTHER): Payer: MEDICAID | Admitting: Mental Health

## 2024-01-02 DIAGNOSIS — F1420 Cocaine dependence, uncomplicated: Secondary | ICD-10-CM | POA: Insufficient documentation

## 2024-01-02 DIAGNOSIS — F39 Unspecified mood [affective] disorder: Secondary | ICD-10-CM | POA: Diagnosis not present

## 2024-01-02 DIAGNOSIS — F4310 Post-traumatic stress disorder, unspecified: Secondary | ICD-10-CM

## 2024-01-02 DIAGNOSIS — F1210 Cannabis abuse, uncomplicated: Secondary | ICD-10-CM | POA: Insufficient documentation
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
## 2024-01-02 NOTE — Telephone Encounter (Signed)
 The therapist reaches out to Valerie Reyes after receiving a referral from [name], [facility]. [name] is driving so asks the therapist to email his direct number so she can call him back which he does.  [name], MA, LCSW, [facility], LCAS 01/02/2024

## 2024-02-06 ENCOUNTER — Encounter: Payer: MEDICAID | Admitting: Family

## 2024-02-06 NOTE — Progress Notes (Signed)
 Erroneous encounter-disregard

## 2024-02-11 ENCOUNTER — Ambulatory Visit: Payer: Self-pay

## 2024-02-11 ENCOUNTER — Telehealth: Payer: MEDICAID | Admitting: Family Medicine

## 2024-02-11 DIAGNOSIS — H10021 Other mucopurulent conjunctivitis, right eye: Secondary | ICD-10-CM

## 2024-02-11 MED ORDER — POLYMYXIN B-TRIMETHOPRIM 10000-0.1 UNIT/ML-% OP SOLN: 1.0000 [drp] | Freq: Four times a day (QID) | OPHTHALMIC | 0 refills | Status: AC

## 2024-02-11 NOTE — Patient Instructions (Addendum)
 Autymn Andra, thank you for joining Chiquita CHRISTELLA Barefoot, NP for today's virtual visit.  While this provider is not your primary care provider (PCP), if your PCP is located in our provider database this encounter information will be shared with them immediately following your visit.   A [facility] MyChart account gives you access to today's visit and all your visits, tests, and labs performed at [facility]  click here if you don't have a [facility] MyChart account or go to [email or web address]  Consent: (Patient) Cherylann Mariotti provided verbal consent for this virtual visit at the beginning of the encounter.  Current Medications:  Current Outpatient Medications:    trimethoprim -polymyxin b  (POLYTRIM ) ophthalmic solution, Place 1 drop into the right eye in the morning, at noon, in the evening, and at bedtime., Disp: 10 mL, Rfl: 0   Accu-Chek Softclix Lancets lancets, To check blood sugars 4 times a day, ( Fasting and 2 hours after the first bite of breakfast, lunch and dinner., Disp: 100 each, Rfl: 12   ARIPiprazole  (ABILIFY ) 5 MG tablet, Take 1 tablet (5 mg total) by mouth daily., Disp: 30 tablet, Rfl: 0   benzocaine -Menthol  (DERMOPLAST) 20-0.5 % AERO, Apply 1 Application topically as needed for irritation (perineal discomfort)., Disp: 78 g, Rfl: 3   Blood Glucose Monitoring Suppl (ACCU-CHEK GUIDE) w/Device KIT, 1 Device by Does not apply route daily., Disp: 1 kit, Rfl: 0   furosemide  (LASIX ) 20 MG tablet, Take 1 tablet (20 mg total) by mouth daily., Disp: 4 tablet, Rfl: 0   ibuprofen  (ADVIL ) 600 MG tablet, Take 1 tablet (600 mg total) by mouth every 6 (six) hours., Disp: 30 tablet, Rfl: 0   Lactic Ac-Citric Ac-Pot Bitart (PHEXXI ) 1.8-1-0.4 % GEL, Insert into vagina prior to intercourse--lasts for one hour, Disp: 5 g, Rfl: 12   NIFEdipine  (ADALAT  CC) 30 MG 24 hr tablet, Take 1 tablet (30 mg total) by mouth daily., Disp: 30 tablet, Rfl: 0   potassium chloride  SA (KLOR-CON   M) 20 MEQ tablet, Take 1 tablet (20 mEq total) by mouth daily., Disp: 4 tablet, Rfl: 0   Prenatal Vit-Fe Fumarate-FA (PRENATAL PLUS VITAMIN/MINERAL) 27-1 MG TABS, Take 1 tablet by mouth daily., Disp: 30 tablet, Rfl: 11   senna-docusate (SENOKOT-S) 8.6-50 MG tablet, Take 2 tablets by mouth daily., Disp: 60 tablet, Rfl: 0   Medications ordered in this encounter:  Meds ordered this encounter  Medications   trimethoprim -polymyxin b  (POLYTRIM ) ophthalmic solution    Sig: Place 1 drop into the right eye in the morning, at noon, in the evening, and at bedtime.    Dispense:  10 mL    Refill:  0    Supervising Provider:   BLAISE ALEENE KIDD [[id number]]     *If you need refills on other medications prior to your next appointment, please contact your pharmacy*  Follow-Up: Call back or seek an in-person evaluation if the symptoms worsen or if the condition fails to improve as anticipated.  [facility] [phone]  Other Instructions  -keep eyes clean and pat dry (you can use baby soap to wash them) -warm compresses -avoid rubbing -change pillowcases -toss make up out that has been recently used -avoid contacts or products in or on the eye until it has improved.    If you have been instructed to have an in-person evaluation today at a local Urgent Care facility, please use the link below. It will take you to a list of all of our available [facility]gent C[facility], including address, phone number and hours of operation. Please do not delay care.  [facility]  If you or a family member do not have a primary care provider, use the link below to schedule a visit and establish care. When you choose a [facility] primary care physician or advanced practice provider, you gain a long-term partner in health. Find a Primary Care Provider  Learn more about [facility]'s in-office and virtual care options: [facility] - Get Care Now
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
